# Patient Record
Sex: Female | Born: 1970 | Race: Black or African American | Hispanic: No | Smoking: Former smoker
Health system: Southern US, Community
[De-identification: ages and names within clinical notes are randomized; demographics above are authoritative.]

## PROBLEM LIST (undated history)

## (undated) DIAGNOSIS — D649 Anemia, unspecified: Secondary | ICD-10-CM

## (undated) DIAGNOSIS — B999 Unspecified infectious disease: Secondary | ICD-10-CM

## (undated) DIAGNOSIS — K862 Cyst of pancreas: Secondary | ICD-10-CM

## (undated) DIAGNOSIS — F329 Major depressive disorder, single episode, unspecified: Secondary | ICD-10-CM

## (undated) DIAGNOSIS — F419 Anxiety disorder, unspecified: Secondary | ICD-10-CM

## (undated) DIAGNOSIS — F32A Depression, unspecified: Secondary | ICD-10-CM

## (undated) HISTORY — PX: DILATION AND CURETTAGE OF UTERUS: SHX78

## (undated) HISTORY — PX: THERAPEUTIC ABORTION: SHX798

## (undated) HISTORY — PX: TUBAL LIGATION: SHX77

---

## 2003-06-08 ENCOUNTER — Emergency Department (HOSPITAL_COMMUNITY): Admission: AD | Admit: 2003-06-08 | Discharge: 2003-06-08 | Payer: Self-pay | Admitting: Family Medicine

## 2006-11-05 ENCOUNTER — Emergency Department (HOSPITAL_COMMUNITY): Admission: EM | Admit: 2006-11-05 | Discharge: 2006-11-06 | Payer: Self-pay | Admitting: *Deleted

## 2006-11-11 ENCOUNTER — Emergency Department (HOSPITAL_COMMUNITY): Admission: EM | Admit: 2006-11-11 | Discharge: 2006-11-11 | Payer: Self-pay | Admitting: Emergency Medicine

## 2008-01-06 ENCOUNTER — Emergency Department (HOSPITAL_COMMUNITY): Admission: EM | Admit: 2008-01-06 | Discharge: 2008-01-07 | Payer: Self-pay | Admitting: Emergency Medicine

## 2008-07-04 ENCOUNTER — Emergency Department (HOSPITAL_COMMUNITY): Admission: EM | Admit: 2008-07-04 | Discharge: 2008-07-04 | Payer: Self-pay | Admitting: Emergency Medicine

## 2008-11-26 ENCOUNTER — Emergency Department (HOSPITAL_COMMUNITY): Admission: EM | Admit: 2008-11-26 | Discharge: 2008-11-26 | Payer: Self-pay | Admitting: Emergency Medicine

## 2009-07-10 ENCOUNTER — Emergency Department (HOSPITAL_COMMUNITY): Admission: EM | Admit: 2009-07-10 | Discharge: 2009-07-10 | Payer: Self-pay | Admitting: Emergency Medicine

## 2009-08-28 ENCOUNTER — Emergency Department (HOSPITAL_COMMUNITY): Admission: EM | Admit: 2009-08-28 | Discharge: 2009-08-29 | Payer: Self-pay | Admitting: Emergency Medicine

## 2010-08-07 LAB — POCT I-STAT, CHEM 8
BUN: 11 mg/dL (ref 6–23)
Calcium, Ion: 1.07 mmol/L — ABNORMAL LOW (ref 1.12–1.32)
Chloride: 104 mEq/L (ref 96–112)
Creatinine, Ser: 1.2 mg/dL (ref 0.4–1.2)
HCT: 50 % — ABNORMAL HIGH (ref 36.0–46.0)
Hemoglobin: 17 g/dL — ABNORMAL HIGH (ref 12.0–15.0)
Sodium: 136 mEq/L (ref 135–145)

## 2010-08-07 LAB — WET PREP, GENITAL
Trich, Wet Prep: NONE SEEN
WBC, Wet Prep HPF POC: NONE SEEN
Yeast Wet Prep HPF POC: NONE SEEN

## 2010-08-07 LAB — URINALYSIS, ROUTINE W REFLEX MICROSCOPIC
Glucose, UA: NEGATIVE mg/dL
Hgb urine dipstick: NEGATIVE
Protein, ur: 30 mg/dL — AB
Specific Gravity, Urine: 1.04 — ABNORMAL HIGH (ref 1.005–1.030)

## 2010-08-07 LAB — CBC
Platelets: 141 10*3/uL — ABNORMAL LOW (ref 150–400)
RBC: 4.76 MIL/uL (ref 3.87–5.11)
RDW: 13.3 % (ref 11.5–15.5)
WBC: 6.4 10*3/uL (ref 4.0–10.5)

## 2010-08-07 LAB — ABO/RH: ABO/RH(D): A POS

## 2010-08-07 LAB — DIFFERENTIAL
Lymphs Abs: 1 10*3/uL (ref 0.7–4.0)
Monocytes Relative: 14 % — ABNORMAL HIGH (ref 3–12)
Neutrophils Relative %: 70 % (ref 43–77)

## 2010-08-07 LAB — GC/CHLAMYDIA PROBE AMP, GENITAL
Chlamydia, DNA Probe: NEGATIVE
GC Probe Amp, Genital: NEGATIVE

## 2010-08-07 LAB — TYPE AND SCREEN: Antibody Screen: NEGATIVE

## 2010-08-25 LAB — POCT I-STAT, CHEM 8
BUN: 11 mg/dL (ref 6–23)
Calcium, Ion: 1.16 mmol/L (ref 1.12–1.32)
Chloride: 107 mEq/L (ref 96–112)
Creatinine, Ser: 1 mg/dL (ref 0.4–1.2)
HCT: 39 % (ref 36.0–46.0)
Sodium: 139 mEq/L (ref 135–145)

## 2010-08-25 LAB — URINALYSIS, ROUTINE W REFLEX MICROSCOPIC
Bilirubin Urine: NEGATIVE
Hgb urine dipstick: NEGATIVE
Nitrite: NEGATIVE

## 2010-08-25 LAB — CBC
Hemoglobin: 13 g/dL (ref 12.0–15.0)
WBC: 3.8 10*3/uL — ABNORMAL LOW (ref 4.0–10.5)

## 2011-03-05 LAB — WET PREP, GENITAL: Yeast Wet Prep HPF POC: NONE SEEN

## 2011-03-05 LAB — URINALYSIS, ROUTINE W REFLEX MICROSCOPIC
Bilirubin Urine: NEGATIVE
Glucose, UA: NEGATIVE
Hgb urine dipstick: NEGATIVE
Ketones, ur: NEGATIVE
Nitrite: NEGATIVE
Specific Gravity, Urine: 1.027

## 2011-03-05 LAB — URINE MICROSCOPIC-ADD ON

## 2011-03-05 LAB — GC/CHLAMYDIA PROBE AMP, GENITAL: Chlamydia, DNA Probe: NEGATIVE

## 2011-03-05 LAB — PREGNANCY, URINE: Preg Test, Ur: NEGATIVE

## 2012-09-10 ENCOUNTER — Emergency Department (HOSPITAL_COMMUNITY)
Admission: EM | Admit: 2012-09-10 | Discharge: 2012-09-10 | Disposition: A | Discharge status: Home / Self Care | Payer: Self-pay | Attending: Emergency Medicine | Admitting: Emergency Medicine

## 2012-09-10 ENCOUNTER — Encounter (HOSPITAL_COMMUNITY): Payer: Self-pay | Admitting: *Deleted

## 2012-09-10 DIAGNOSIS — M545 Low back pain, unspecified: Secondary | ICD-10-CM | POA: Insufficient documentation

## 2012-09-10 DIAGNOSIS — Z760 Encounter for issue of repeat prescription: Secondary | ICD-10-CM | POA: Insufficient documentation

## 2012-09-10 DIAGNOSIS — F17200 Nicotine dependence, unspecified, uncomplicated: Secondary | ICD-10-CM | POA: Insufficient documentation

## 2012-09-10 DIAGNOSIS — G8929 Other chronic pain: Secondary | ICD-10-CM | POA: Insufficient documentation

## 2012-09-10 MED ORDER — OXYCODONE-ACETAMINOPHEN 5-325 MG PO TABS
1.0000 | ORAL_TABLET | Freq: Once | ORAL | Status: AC
  Administered 2012-09-10: 1 via ORAL
  Filled 2012-09-10: qty 1

## 2012-09-10 MED ORDER — TRAMADOL HCL 50 MG PO TABS: 50.0000 mg | ORAL_TABLET | Freq: Four times a day (QID) | ORAL | Status: DC | PRN

## 2012-09-10 NOTE — ED Notes (Signed)
Pt unhappy about not getting Percocet. "I'll just be back in the morning....this (Tramadol) is like taking Tylenol".

## 2012-09-10 NOTE — ED Notes (Signed)
Pt needs refill on percocet.  She fell through roof in June of last year and has run out of meds for chronic back pain.  Called her lawyer and he stated to come to ED for refill

## 2012-09-10 NOTE — ED Provider Notes (Signed)
History     CSN: [id number]  Arrival date & time 09/10/12  1217   First MD Initiated Contact with Patient 09/10/12 1259      Chief Complaint  Patient presents with  . Medication Refill    (Consider location/radiation/quality/duration/timing/severity/associated sxs/prior treatment) HPI Comments: Patient reports to the ED for chronic back pain. Reports that she fell through a roof in June 2013 and was given Percocet to manage her pain, but she has recently run out.  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 States she called her lawyer and was told to come to the ED for refills. Patient has been seeing chiropractor, Dr. Christell Faith, for physical therapy.  No new injury or trauma. Denies any numbness or paresthesias of lower extremities. No loss of bowel or bladder function.  Patient does not have a primary care physician at this time.  The history is provided by the patient.    History reviewed. No pertinent past medical history.  History reviewed. No pertinent past surgical history.  No family history on file.  History  Substance Use Topics  . Smoking status: Current Every Day Smoker -- 0.25 packs/day    Types: Cigarettes  . Smokeless tobacco: Not on file  . Alcohol Use: No    OB History   Grav Para Term Preterm Abortions TAB SAB Ect Mult Living                  Review of Systems  Musculoskeletal: Positive for back pain.  All other systems reviewed and are negative.    Allergies  Review of patient's allergies indicates no known allergies.  Home Medications   Current Outpatient Rx  Name  Route  Sig  Dispense  Refill  . DiphenhydrAMINE HCl (DIPHEDRYL PO)   Oral   Take 2 capsules by mouth 2 (two) times daily as needed (allergies).           BP 111/78  Pulse 100  Temp(Src) 97.9 F (36.6 C) (Oral)  Resp 16  SpO2 99%  LMP 08/29/2012  Physical Exam  Nursing note and vitals reviewed. Constitutional:  She is oriented to person, place, and time. She appears well-developed and well-nourished.  HENT:  Head: Normocephalic and atraumatic.  Mouth/Throat: Oropharynx is clear and moist.  Eyes: Conjunctivae and EOM are normal. Pupils are equal, round, and reactive to light.  Neck: Normal range of motion.  Cardiovascular: Normal rate, regular rhythm and normal heart sounds.   Pulmonary/Chest: Effort normal and breath sounds normal.  Musculoskeletal:       Lumbar back: She exhibits tenderness and pain. She exhibits normal range of motion, no bony tenderness, no swelling, no edema, no deformity, no laceration, no spasm and normal pulse.  LS pain without bony tenderness, normal ROM and strength, distal pulses and sensation intact  Neurological: She is alert and oriented to person, place, and time.  Normal gait  Skin: Skin is warm and dry.  Psychiatric: She has a normal mood and affect.    ED Course  Procedures (including critical care time)  Labs Reviewed - No data to display No results found.   1. Back pain, chronic       MDM   Patient with chronic back pain s/p injury one year ago.  No new trauma or injury, neurovascularly intact- imagining deferred at this time.  Encouraged patient to establish with PCP, she will likely need referral for pain management.  Resource guide given.  Informed patient that we do not manage chronic pain in the ED- pt is upset by this.  Rx tramadol.  Discussed plan with pt- she agreed.  Return precautions advised.      Garlon Hatchet, PA-C 09/10/12 1611

## 2012-09-10 NOTE — ED Notes (Signed)
Pt was involved in an accident in [place] where she fell through a roof. She was receiving PT but then moved to [facility] in Sept 2013. Here for a refill of her Percocet. Her lawyer told her to go to the nearest ED. Pt c/o pain from her neck to lower back.

## 2012-09-10 NOTE — ED Provider Notes (Signed)
Medical screening examination/treatment/procedure(s) were performed by non-physician practitioner and as supervising physician I was immediately available for consultation/collaboration.    Vida Roller, MD 09/10/12 2134

## 2012-11-15 ENCOUNTER — Inpatient Hospital Stay (HOSPITAL_COMMUNITY)
Admission: AD | Admit: 2012-11-15 | Discharge: 2012-11-15 | Disposition: A | Discharge status: Home / Self Care | Payer: Self-pay | Source: Ambulatory Visit | Attending: Obstetrics & Gynecology | Admitting: Obstetrics & Gynecology

## 2012-11-15 ENCOUNTER — Encounter (HOSPITAL_COMMUNITY): Payer: Self-pay | Admitting: *Deleted

## 2012-11-15 DIAGNOSIS — B9689 Other specified bacterial agents as the cause of diseases classified elsewhere: Secondary | ICD-10-CM | POA: Insufficient documentation

## 2012-11-15 DIAGNOSIS — R109 Unspecified abdominal pain: Secondary | ICD-10-CM | POA: Insufficient documentation

## 2012-11-15 DIAGNOSIS — N760 Acute vaginitis: Secondary | ICD-10-CM | POA: Insufficient documentation

## 2012-11-15 DIAGNOSIS — A499 Bacterial infection, unspecified: Secondary | ICD-10-CM | POA: Insufficient documentation

## 2012-11-15 DIAGNOSIS — A599 Trichomoniasis, unspecified: Secondary | ICD-10-CM

## 2012-11-15 DIAGNOSIS — N949 Unspecified condition associated with female genital organs and menstrual cycle: Secondary | ICD-10-CM | POA: Insufficient documentation

## 2012-11-15 DIAGNOSIS — R102 Pelvic and perineal pain: Secondary | ICD-10-CM

## 2012-11-15 DIAGNOSIS — A5901 Trichomonal vulvovaginitis: Secondary | ICD-10-CM | POA: Insufficient documentation

## 2012-11-15 HISTORY — DX: Cyst of pancreas: K86.2

## 2012-11-15 LAB — URINE MICROSCOPIC-ADD ON

## 2012-11-15 LAB — URINALYSIS, ROUTINE W REFLEX MICROSCOPIC
Bilirubin Urine: NEGATIVE
Glucose, UA: NEGATIVE mg/dL
Ketones, ur: NEGATIVE mg/dL
Protein, ur: NEGATIVE mg/dL

## 2012-11-15 LAB — WET PREP, GENITAL

## 2012-11-15 LAB — POCT PREGNANCY, URINE: Preg Test, Ur: NEGATIVE

## 2012-11-15 MED ORDER — METRONIDAZOLE 500 MG PO TABS: 500.0000 mg | ORAL_TABLET | Freq: Two times a day (BID) | ORAL | Status: DC

## 2012-11-15 NOTE — MAU Note (Signed)
Patient states she has been having lower abdominal pain for about 2 weeks. Not sure of exact date of last period. Had spotting with intercourse about one week ago, none now. Has a white discharge.

## 2012-11-15 NOTE — MAU Provider Note (Signed)
History     CSN: [id number]  Arrival date and time: 11/15/12 1652   First Provider Initiated Contact with Patient 11/15/12 1847      Chief Complaint  Patient presents with  . Abdominal Pain   HPI  Pt is here with report of lower pelvic pain x two months.  Reports increased pain with intercourse and spotting that began two weeks ago.  New sexual partner.  +vaginal odor.    Past Medical History  Diagnosis Date  . Pancreatic cyst     Past Surgical History  Procedure Laterality Date  . Dilation and curettage of uterus      History reviewed. No pertinent family history.  History  Substance Use Topics  . Smoking status: Former Smoker -- 0.25 packs/day    Types: Cigarettes  . Smokeless tobacco: Not on file  . Alcohol Use: No    Allergies: No Known Allergies  Prescriptions prior to admission  Medication Sig Dispense Refill  . DiphenhydrAMINE HCl (DIPHEDRYL PO) Take 2 capsules by mouth 2 (two) times daily as needed (allergies).      . traMADol (ULTRAM) 50 MG tablet Take 1 tablet (50 mg total) by mouth every 6 (six) hours as needed for pain.  15 tablet  0    Review of Systems  Gastrointestinal: Positive for abdominal pain (lower pelvic). Negative for nausea and vomiting.  Genitourinary: Positive for frequency.       Vaginal odor   Physical Exam   Blood pressure 106/68, pulse 66, temperature 98.7 F (37.1 C), temperature source Oral, resp. rate 16, height 5\' 4"  (1.626 m), last menstrual period 10/24/2012, SpO2 100.00%.  Physical Exam  Constitutional: She is oriented to person, place, and time. She appears well-developed and well-nourished. No distress.  HENT:  Head: Normocephalic.  Neck: Normal range of motion. Neck supple.  Cardiovascular: Normal rate, regular rhythm and normal heart sounds.   Respiratory: Effort normal and breath sounds normal. No respiratory distress.  GI: Soft. There is no tenderness (mild, lower pelvis). There is no CVA tenderness.   Genitourinary: Cervix exhibits no motion tenderness and no discharge. Vaginal discharge (white, creamy) found.  IUD strings visualized at cervical os  Musculoskeletal: Normal range of motion.  Neurological: She is alert and oriented to person, place, and time.  Skin: Skin is warm and dry.  Psychiatric: She has a normal mood and affect.    MAU Course  Procedures Results for orders placed during the hospital encounter of 11/15/12 (from the past 24 hour(s))  URINALYSIS, ROUTINE W REFLEX MICROSCOPIC     Status: Abnormal   Collection Time    11/15/12  5:15 PM      Result Value Range   Color, Urine YELLOW  YELLOW   APPearance CLEAR  CLEAR   Specific Gravity, Urine >1.030 (*) 1.005 - 1.030   pH 6.0  5.0 - 8.0   Glucose, UA NEGATIVE  NEGATIVE mg/dL   Hgb urine dipstick TRACE (*) NEGATIVE   Bilirubin Urine NEGATIVE  NEGATIVE   Ketones, ur NEGATIVE  NEGATIVE mg/dL   Protein, ur NEGATIVE  NEGATIVE mg/dL   Urobilinogen, UA 0.2  0.0 - 1.0 mg/dL   Nitrite NEGATIVE  NEGATIVE   Leukocytes, UA NEGATIVE  NEGATIVE  URINE MICROSCOPIC-ADD ON     Status: Abnormal   Collection Time    11/15/12  5:15 PM      Result Value Range   Squamous Epithelial / LPF RARE  RARE   RBC / HPF    <  3 RBC/hpf   Value: NO FORMED ELEMENTS SEEN ON URINE MICROSCOPIC EXAMINATION   Crystals CA OXALATE CRYSTALS (*) NEGATIVE  POCT PREGNANCY, URINE     Status: None   Collection Time    11/15/12  5:18 PM      Result Value Range   Preg Test, Ur NEGATIVE  NEGATIVE  WET PREP, GENITAL     Status: Abnormal   Collection Time    11/15/12  6:55 PM      Result Value Range   Yeast Wet Prep HPF POC NONE SEEN  NONE SEEN   Trich, Wet Prep FEW (*) NONE SEEN   Clue Cells Wet Prep HPF POC FEW (*) NONE SEEN   WBC, Wet Prep HPF POC FEW (*) NONE SEEN    Assessment and Plan  Trichomoniasis Bacterial Vaginosis  Plan: DC to home RX Flagyl 500 mg BID x 7 days GC/CT pending Outpatient pelvic  ultrasound   [facility] 11/15/2012, 6:49 PM

## 2012-11-16 LAB — GC/CHLAMYDIA PROBE AMP
CT Probe RNA: NEGATIVE
GC Probe RNA: NEGATIVE

## 2012-11-18 NOTE — MAU Provider Note (Signed)
Attestation of Attending Supervision of Advanced Practitioner (CNM/NP): Evaluation and management procedures were performed by the Advanced Practitioner under my supervision and collaboration. I have reviewed the Advanced Practitioner's note and chart, and I agree with the management and plan.  Trishna Cwik H. 4:14 PM   

## 2012-11-23 ENCOUNTER — Ambulatory Visit (HOSPITAL_COMMUNITY): Admission: RE | Admit: 2012-11-23 | Payer: Self-pay | Source: Ambulatory Visit

## 2014-03-20 ENCOUNTER — Encounter (HOSPITAL_COMMUNITY): Payer: Self-pay | Admitting: *Deleted

## 2014-08-02 ENCOUNTER — Encounter (HOSPITAL_COMMUNITY): Payer: Self-pay | Admitting: Emergency Medicine

## 2014-08-02 ENCOUNTER — Emergency Department (INDEPENDENT_AMBULATORY_CARE_PROVIDER_SITE_OTHER)
Admission: EM | Admit: 2014-08-02 | Discharge: 2014-08-02 | Disposition: A | Discharge status: Home / Self Care | Payer: Self-pay | Source: Home / Self Care | Attending: Family Medicine | Admitting: Family Medicine

## 2014-08-02 DIAGNOSIS — B86 Scabies: Secondary | ICD-10-CM

## 2014-08-02 MED ORDER — PERMETHRIN 5 % EX CREA: TOPICAL_CREAM | CUTANEOUS | Status: DC

## 2014-08-02 NOTE — ED Notes (Signed)
Pt c/o rash all over body onset 2 days Reports cough as well Sleeping at friends house Granddaughter is also being seen for similar sx Alert, no signs of acute distress.

## 2014-08-02 NOTE — Discharge Instructions (Signed)
Thank you for coming in today. °Apply the permethrin cream from the neck down at night and wash off in the morning. °Use Benadryl at night as needed for itching. °Use Gold Bond Itch as needed. °Come back if not getting better or worsening. ° ° °Scabies °Scabies are small bugs (mites) that burrow under the skin and cause red bumps and severe itching. These bugs can only be seen with a microscope. Scabies are highly contagious. They can spread easily from person to person by direct contact. They are also spread through sharing clothing or linens that have the scabies mites living in them. It is not unusual for an entire family to become infected through shared towels, clothing, or bedding.  °HOME CARE INSTRUCTIONS  °· Your caregiver may prescribe a cream or lotion to kill the mites. If cream is prescribed, massage the cream into the entire body from the neck to the bottom of both feet. Also massage the cream into the scalp and face if your child is less than 1 year old. Avoid the eyes and mouth. Do not wash your hands after application. °· Leave the cream on for 8 to 12 hours. Your child should bathe or shower after the 8 to 12 hour application period. Sometimes it is helpful to apply the cream to your child right before bedtime. °· One treatment is usually effective and will eliminate approximately 95% of infestations. For severe cases, your caregiver may decide to repeat the treatment in 1 week. Everyone in your household should be treated with one application of the cream. °· New rashes or burrows should not appear within 24 to 48 hours after successful treatment. However, the itching and rash may last for 2 to 4 weeks after successful treatment. Your caregiver may prescribe a medicine to help with the itching or to help the rash go away more quickly. °· Scabies can live on clothing or linens for up to 3 days. All of your child's recently used clothing, towels, stuffed toys, and bed linens should be washed in hot  water and then dried in a dryer for at least 20 minutes on high heat. Items that cannot be washed should be enclosed in a plastic bag for at least 3 days. °· To help relieve itching, bathe your child in a cool bath or apply cool washcloths to the affected areas. °· Your child may return to school after treatment with the prescribed cream. °SEEK MEDICAL CARE IF:  °· The itching persists longer than 4 weeks after treatment. °· The rash spreads or becomes infected. Signs of infection include red blisters or yellow-tan crust. °Document Released: 05/05/2005 Document Revised: 07/28/2011 Document Reviewed: 09/13/2008 °ExitCare® Patient Information ©2015 [employer]. This information is not intended to replace advice given to you by your health care provider. Make sure you discuss any questions you have with your health care provider. ° °

## 2014-08-02 NOTE — ED Provider Notes (Signed)
Diana SchleinFelicia [place] is a 44 y.o. female who presents to Urgent Care today for rash. Patient has a pruritic rash on her trunk and extremities present for the last several days. Her daughter has a similar rash. No fevers or chills vomiting or diarrhea. No treatment tried yet. Rash started after staying at somebody's house.  No new soaps detergents shampoos or cosmetics.   Past Medical History  Diagnosis Date  . Pancreatic cyst    Past Surgical History  Procedure Laterality Date  . Dilation and curettage of uterus     History  Substance Use Topics  . Smoking status: Former Smoker -- 0.25 packs/day    Types: Cigarettes  . Smokeless tobacco: Not on file  . Alcohol Use: No   ROS as above Medications: No current facility-administered medications for this encounter.   Current Outpatient Prescriptions  Medication Sig Dispense Refill  . DiphenhydrAMINE HCl (DIPHEDRYL PO) Take 2 capsules by mouth 2 (two) times daily as needed (allergies).    . metroNIDAZOLE (FLAGYL) 500 MG tablet Take 1 tablet (500 mg total) by mouth 2 (two) times daily. 14 tablet 0  . permethrin (ELIMITE) 5 % cream Apply from the neck down at night and wash off in the morning once 180 g 1  . traMADol (ULTRAM) 50 MG tablet Take 1 tablet (50 mg total) by mouth every 6 (six) hours as needed for pain. 15 tablet 0   No Known Allergies   Exam:  BP 118/75 mmHg  Pulse 71  Temp(Src) 98.7 F (37.1 C) (Oral)  Resp 16  SpO2 96% Gen: Well NAD HEENT: EOMI,  MMM no mucocutaneous lesions Lungs: Normal work of breathing. CTABL Heart: RRR no MRG Exts: Brisk capillary refill, warm and well perfused.   skin: Small pruritic excoriated papules on trunk and extremities consistent with scabies  No results found for this or any previous visit (from the past 24 hour(s)). No results found.  Assessment and Plan: 44 y.o. female with scabies. Treatment with permethrin  Discussed warning signs or symptoms. Please see discharge instructions.  Patient expresses understanding.     Rodolph BongEvan S Chaylee Ehrsam, MD 08/02/14 1435

## 2014-11-05 ENCOUNTER — Encounter (HOSPITAL_COMMUNITY): Payer: Self-pay | Admitting: *Deleted

## 2014-11-05 ENCOUNTER — Inpatient Hospital Stay (HOSPITAL_COMMUNITY)
Admission: AD | Admit: 2014-11-05 | Discharge: 2014-11-05 | Disposition: A | Discharge status: Home / Self Care | Payer: Self-pay | Source: Ambulatory Visit | Attending: Obstetrics & Gynecology | Admitting: Obstetrics & Gynecology

## 2014-11-05 DIAGNOSIS — F17210 Nicotine dependence, cigarettes, uncomplicated: Secondary | ICD-10-CM | POA: Insufficient documentation

## 2014-11-05 DIAGNOSIS — N3000 Acute cystitis without hematuria: Secondary | ICD-10-CM | POA: Insufficient documentation

## 2014-11-05 HISTORY — DX: Anemia, unspecified: D64.9

## 2014-11-05 HISTORY — DX: Unspecified infectious disease: B99.9

## 2014-11-05 LAB — URINALYSIS, ROUTINE W REFLEX MICROSCOPIC
Bilirubin Urine: NEGATIVE
Glucose, UA: NEGATIVE mg/dL
Ketones, ur: NEGATIVE mg/dL
Nitrite: NEGATIVE
Protein, ur: NEGATIVE mg/dL
Urobilinogen, UA: 0.2 mg/dL (ref 0.0–1.0)
pH: 6 (ref 5.0–8.0)

## 2014-11-05 LAB — URINE MICROSCOPIC-ADD ON

## 2014-11-05 LAB — POCT PREGNANCY, URINE: Preg Test, Ur: NEGATIVE

## 2014-11-05 MED ORDER — CIPROFLOXACIN HCL 500 MG PO TABS: 500.0000 mg | ORAL_TABLET | Freq: Two times a day (BID) | ORAL | Status: DC

## 2014-11-05 NOTE — MAU Note (Addendum)
On Wed, was on cycle, had dropped tampon, cleaned off plastic applicator with feminine wipe, since then has had pain with urination. Having freq and urgency

## 2014-11-05 NOTE — MAU Provider Note (Signed)
History     CSN: [id number]  Arrival date and time: 11/05/14 1217   First Provider Initiated Contact with Patient 11/05/14 1248      Chief Complaint  Patient presents with  . Dysuria   HPI Comments: Diana Oconnell is a 44 y.o. [id number] with onset of progressively worsening and stream dysuria over the past 5 days. She's also had urinary frequency voiding several times a day, nocturia, urgency. She is concerned that she had dropped tampon on the floor and wiped it off with Vagifem is a day before the symptoms started. She has no systemic symptoms.She denies irritative vaginal discharge. Declines pelvic exam. Does not want contraception.   Dysuria  This is a new problem. The current episode started in the past 7 days. The problem occurs every urination. The problem has been gradually worsening. The quality of the pain is described as burning. The pain is at a severity of 6/10. The pain is moderate. There has been no fever. She is sexually active. There is no history of pyelonephritis. Associated symptoms include frequency, a possible pregnancy and urgency. Pertinent negatives include no discharge, flank pain, hematuria, hesitancy, nausea, sweats or vomiting. She has tried nothing for the symptoms. There is no history of catheterization, kidney stones, recurrent UTIs, a single kidney, urinary stasis or a urological procedure. Last UTI in 1992    OB History  Gravida Para Term Preterm AB SAB TAB Ectopic Multiple Living  # Outcome Date GA Lbr Len/2nd Weight Sex Delivery Anes PTL Lv  11 TAB           10 TAB           9 TAB           8 SAB           7 SAB           6 SAB           5 Term      Vag-Spont   Y  4 Term      Vag-Spont  N Y  3 Term      Vag-Spont  N Y  2 Term      Vag-Spont  N Y  1 Term      Vag-Spont  N ND     Comments: anacephalic      Past Medical History  Diagnosis Date  . Pancreatic cyst   . Anemia   . Infection     UTI    Past  Surgical History  Procedure Laterality Date  . Dilation and curettage of uterus    . Therapeutic abortion      multiple    Family History  Problem Relation Age of Onset  . Hypertension Father     History  Substance Use Topics  . Smoking status: Current Every Day Smoker -- 0.25 packs/day for 12 years    Types: Cigarettes  . Smokeless tobacco: Never Used  . Alcohol Use: No    Allergies: No Known Allergies  Prescriptions prior to admission  Medication Sig Dispense Refill Last Dose  . DiphenhydrAMINE HCl (DIPHEDRYL PO) Take 2 capsules by mouth 2 (two) times daily as needed (allergies).   Unknown at Unknown time  . metroNIDAZOLE (FLAGYL) 500 MG tablet Take 1 tablet (500 mg total) by mouth 2 (two) times daily. 14 tablet 0 Unknown at Unknown time  . permethrin (ELIMITE) 5 % cream Apply  from the neck down at night and wash off in the morning once 180 g 1   . traMADol (ULTRAM) 50 MG tablet Take 1 tablet (50 mg total) by mouth every 6 (six) hours as needed for pain. 15 tablet 0 Unknown at Unknown time    Review of Systems  Constitutional: Negative for fever and weight loss.  Cardiovascular: Negative for chest pain.  Gastrointestinal: Negative for nausea and vomiting.  Genitourinary: Positive for dysuria, urgency and frequency. Negative for hesitancy, hematuria and flank pain.  Neurological: Negative for dizziness and headaches.  Psychiatric/Behavioral: Negative for depression. The patient is not nervous/anxious.    Physical Exam   Blood pressure 95/58, pulse 91, temperature 98.3 F (36.8 C), temperature source Oral, resp. rate 16, height 5' 5.5" (1.664 m), weight 58.968 kg (130 lb), last menstrual period 10/27/2014.  Physical Exam  Nursing note and vitals reviewed. Constitutional: She is oriented to person, place, and time. She appears well-developed and well-nourished. No distress.  HENT:  Head: Normocephalic and atraumatic.  Eyes: EOM are normal. Pupils are equal, round, and  reactive to light.  Neck: Normal range of motion. Neck supple.  Cardiovascular: Normal rate.   Respiratory: Effort normal and breath sounds normal.  GI: She exhibits no distension. There is no tenderness. There is no rebound and no guarding.  Genitourinary:  Negative CVAT  Musculoskeletal: Normal range of motion.  Neurological: She is alert and oriented to person, place, and time.  Skin: Skin is warm and dry.  Psychiatric: She has a normal mood and affect. Her behavior is normal. Thought content normal.    MAU Course  Procedures Results for orders placed or performed during the hospital encounter of 11/05/14 (from the past 24 hour(s))  Urinalysis, Routine w reflex microscopic (not at [facility])     Status: Abnormal   Collection Time: 11/05/14 12:30 PM  Result Value Ref Range   Color, Urine YELLOW YELLOW   APPearance CLOUDY (A) CLEAR   Specific Gravity, Urine >1.030 (H) 1.005 - 1.030   pH 6.0 5.0 - 8.0   Glucose, UA NEGATIVE NEGATIVE mg/dL   Hgb urine dipstick SMALL (A) NEGATIVE   Bilirubin Urine NEGATIVE NEGATIVE   Ketones, ur NEGATIVE NEGATIVE mg/dL   Protein, ur NEGATIVE NEGATIVE mg/dL   Urobilinogen, UA 0.2 0.0 - 1.0 mg/dL   Nitrite NEGATIVE NEGATIVE   Leukocytes, UA MODERATE (A) NEGATIVE  Urine microscopic-add on     Status: Abnormal   Collection Time: 11/05/14 12:30 PM  Result Value Ref Range   Squamous Epithelial / LPF FEW (A) RARE   WBC, UA 7-10 <3 WBC/hpf   RBC / HPF 0-2 <3 RBC/hpf   Bacteria, UA FEW (A) RARE  Pregnancy, urine POC     Status: None   Collection Time: 11/05/14 12:34 PM  Result Value Ref Range   Preg Test, Ur NEGATIVE NEGATIVE    Assessment and Plan   1. Acute cystitis without hematuria      Medication List    STOP taking these medications        DIPHEDRYL PO     metroNIDAZOLE 500 MG tablet  Commonly known as:  FLAGYL     permethrin 5 % cream  Commonly known as:  ELIMITE     traMADol 50 MG tablet  Commonly known as:  ULTRAM      TAKE  these medications        ciprofloxacin 500 MG tablet  Commonly known as:  CIPRO  Take 1 tablet (500  mg total) by mouth 2 (two) times daily.       Follow-up Information    Schedule an appointment as soon as possible for a visit with [facility] [place].   Why:  If symptoms worsen   Contact information:   [address] [place] [address] [phone]      Taft Worthing 11/05/2014, 12:49 PM

## 2014-11-05 NOTE — Discharge Instructions (Signed)

## 2015-03-11 ENCOUNTER — Emergency Department (HOSPITAL_COMMUNITY)
Admission: EM | Admit: 2015-03-11 | Discharge: 2015-03-12 | Disposition: A | Discharge status: Home / Self Care | Payer: Self-pay | Attending: Emergency Medicine | Admitting: Emergency Medicine

## 2015-03-11 ENCOUNTER — Encounter (HOSPITAL_COMMUNITY): Payer: Self-pay | Admitting: Vascular Surgery

## 2015-03-11 ENCOUNTER — Emergency Department (HOSPITAL_COMMUNITY): Payer: Self-pay

## 2015-03-11 ENCOUNTER — Emergency Department (HOSPITAL_COMMUNITY)
Admission: EM | Admit: 2015-03-11 | Discharge: 2015-03-11 | Discharge status: Absent without leave | Payer: Self-pay | Source: Home / Self Care

## 2015-03-11 DIAGNOSIS — Z862 Personal history of diseases of the blood and blood-forming organs and certain disorders involving the immune mechanism: Secondary | ICD-10-CM | POA: Insufficient documentation

## 2015-03-11 DIAGNOSIS — Z3202 Encounter for pregnancy test, result negative: Secondary | ICD-10-CM | POA: Insufficient documentation

## 2015-03-11 DIAGNOSIS — Z87440 Personal history of urinary (tract) infections: Secondary | ICD-10-CM | POA: Insufficient documentation

## 2015-03-11 DIAGNOSIS — Z792 Long term (current) use of antibiotics: Secondary | ICD-10-CM | POA: Insufficient documentation

## 2015-03-11 DIAGNOSIS — Z720 Tobacco use: Secondary | ICD-10-CM | POA: Insufficient documentation

## 2015-03-11 DIAGNOSIS — K862 Cyst of pancreas: Secondary | ICD-10-CM | POA: Insufficient documentation

## 2015-03-11 DIAGNOSIS — R1030 Lower abdominal pain, unspecified: Secondary | ICD-10-CM

## 2015-03-11 LAB — URINALYSIS, ROUTINE W REFLEX MICROSCOPIC
Bilirubin Urine: NEGATIVE
Glucose, UA: NEGATIVE mg/dL
Hgb urine dipstick: NEGATIVE
Ketones, ur: 15 mg/dL — AB
LEUKOCYTES UA: NEGATIVE
NITRITE: NEGATIVE
PH: 5 (ref 5.0–8.0)
Protein, ur: NEGATIVE mg/dL
SPECIFIC GRAVITY, URINE: 1.018 (ref 1.005–1.030)
Urobilinogen, UA: 0.2 mg/dL (ref 0.0–1.0)

## 2015-03-11 LAB — BASIC METABOLIC PANEL
Anion gap: 10 (ref 5–15)
BUN: 17 mg/dL (ref 6–20)
CHLORIDE: 104 mmol/L (ref 101–111)
CO2: 22 mmol/L (ref 22–32)
CREATININE: 1.23 mg/dL — AB (ref 0.44–1.00)
Calcium: 9.3 mg/dL (ref 8.9–10.3)
GFR calc non Af Amer: 54 mL/min — ABNORMAL LOW (ref >60)
Glucose, Bld: 94 mg/dL (ref 65–99)
POTASSIUM: 3.5 mmol/L (ref 3.5–5.1)
SODIUM: 136 mmol/L (ref 135–145)

## 2015-03-11 LAB — POC URINE PREG, ED: Preg Test, Ur: NEGATIVE

## 2015-03-11 MED ORDER — SODIUM CHLORIDE 0.9 % IV BOLUS (SEPSIS)
1000.0000 mL | Freq: Once | INTRAVENOUS | Status: AC
  Administered 2015-03-11: 1000 mL via INTRAVENOUS

## 2015-03-11 MED ORDER — SODIUM CHLORIDE 0.9 % IV SOLN
INTRAVENOUS | Status: DC
Start: 2015-03-11 — End: 2015-03-12

## 2015-03-11 MED ORDER — ONDANSETRON HCL 4 MG/2ML IJ SOLN
4.0000 mg | Freq: Once | INTRAMUSCULAR | Status: AC
  Administered 2015-03-11: 4 mg via INTRAVENOUS
  Filled 2015-03-11: qty 2

## 2015-03-11 MED ORDER — IOHEXOL 300 MG/ML  SOLN
100.0000 mL | Freq: Once | INTRAMUSCULAR | Status: AC | PRN
  Administered 2015-03-11: 100 mL via INTRAVENOUS

## 2015-03-11 NOTE — ED Notes (Signed)
Pt reports to the ED for eval of urinary retention that began yesterday. Reports she has not been able to urinate normally since yesterday at 12 pm. States when she gets the urge to go it will cause a sharp pain but she will only be able to get out a few drops. Also reports abd pain and constipation x 4 days. Denies any N/V, vaginal d/c, or bleeding. Pt tearful in triage. Pt A&OX4, resp e/u, and skin warm and dry.

## 2015-03-11 NOTE — ED Provider Notes (Signed)
CSN: [id number]     Arrival date & time 03/11/15  2033 History   First MD Initiated Contact with Patient 03/11/15 2219     Chief Complaint  Patient presents with  . Urinary Retention     (Consider location/radiation/quality/duration/timing/severity/associated sxs/prior Treatment) The history is provided by the patient.   we 44-year-old female acute onset of suprapubic abdominal pain yesterday a constant sharp increases when trying to urinate. Associated with urinary urgency, decreased urine, and urgency. No nausea no vomiting. No bowel movements for 4 days. Patient feels like she's constipated. Patient denies any vaginal discharge or bleeding. Pain can be 10 out of 10 at times. Currently pain is minimal. No fevers. No back pain.  Past Medical History  Diagnosis Date  . Pancreatic cyst   . Anemia   . Infection     UTI   Past Surgical History  Procedure Laterality Date  . Dilation and curettage of uterus    . Therapeutic abortion      multiple   Family History  Problem Relation Age of Onset  . Hypertension Father    Social History  Substance Use Topics  . Smoking status: Current Every Day Smoker -- 0.25 packs/day for 12 years    Types: Cigarettes  . Smokeless tobacco: Never Used  . Alcohol Use: No   OB History    Gravida Para Term Preterm AB TAB SAB Ectopic Multiple Living   Review of Systems  Constitutional: Negative for fever.  HENT: Negative for congestion.   Eyes: Negative for redness.  Respiratory: Negative for shortness of breath.   Gastrointestinal: Positive for abdominal pain and constipation. Negative for nausea, vomiting, diarrhea and abdominal distention.  Genitourinary: Positive for dysuria, urgency and difficulty urinating. Negative for vaginal bleeding and vaginal discharge.  Musculoskeletal: Negative for back pain.  Skin: Negative for rash.  Neurological: Negative for headaches.  Hematological: Does not bruise/bleed easily.   Psychiatric/Behavioral: Negative for confusion.      Allergies  Review of patient's allergies indicates no known allergies.  Home Medications   Prior to Admission medications   Medication Sig Start Date End Date Taking? Authorizing Provider  ciprofloxacin (CIPRO) 500 MG tablet Take 1 tablet (500 mg total) by mouth 2 (two) times daily. 11/05/14   Deirdre C Poe, CNM   BP 105/78 mmHg  Pulse 110  Temp(Src) 98.2 F (36.8 C) (Oral)  Resp 16  SpO2 97% Physical Exam  Constitutional: She is oriented to person, place, and time. She appears well-developed and well-nourished. No distress.  HENT:  Head: Normocephalic and atraumatic.  Mouth/Throat: Oropharynx is clear and moist.  Eyes: Conjunctivae and EOM are normal. Pupils are equal, round, and reactive to light.  Neck: Normal range of motion. Neck supple.  Cardiovascular: Normal rate, regular rhythm and normal heart sounds.   No murmur heard. Abdominal: Soft. Bowel sounds are normal. There is tenderness.  Tenderness without guarding in the suprapubic area. No left lower quadrant or right lower quadrant tenderness. No mass.  Musculoskeletal: Normal range of motion. She exhibits no edema.  Neurological: She is alert and oriented to person, place, and time. No cranial nerve deficit. She exhibits normal muscle tone. Coordination normal.  Skin: Skin is warm.  Nursing note and vitals reviewed.   ED Course  Procedures (including critical care time) Labs Review Labs Reviewed  URINALYSIS, ROUTINE W REFLEX MICROSCOPIC (NOT AT [facility]) - Abnormal; Notable for the following:  APPearance CLOUDY (*)    Ketones, ur 15 (*)    All other components within normal limits  CBC WITH DIFFERENTIAL/PLATELET  BASIC METABOLIC PANEL  POC URINE PREG, ED    Imaging Review No results found. I have personally reviewed and evaluated these images and lab results as part of my medical decision-making.   EKG Interpretation None      MDM   Final  diagnoses:  Lower abdominal pain   Patient with a onset of suprapubic abdominal pain yesterday morning. Associated with urinary dysuria and urgency. Patient only dribbling of urine. Bladder scan here showed the bladder to only have 250 mL of urine at urinalysis is normal. Patient with some tenderness over the suprapubic area. Patient denies any vaginal discharge.  We'll go ahead and get labs IV fluids and get a CT scan abdomen and pelvis. Disposition will be based on that. Patient's pregnancy test is -2. Patient's vital signs without any significant abnormalities other than some tachycardia with a heart rate of 110. No hypotension.  Patient describes the abdominal pain is sharp at times physically when she needs to urinate. Patient also states that she hasn't had a bowel movement in 4 days. Patient without any intra-abdominal surgeries in the past.    Vanetta MuldersScott Horacio Werth, MD 03/11/15 2237

## 2015-03-11 NOTE — ED Notes (Signed)
250cc's noted via bladder scan.

## 2015-03-12 LAB — CBC WITH DIFFERENTIAL/PLATELET
BASOS ABS: 0 10*3/uL (ref 0.0–0.1)
BASOS PCT: 0 %
Eosinophils Absolute: 0 10*3/uL (ref 0.0–0.7)
Eosinophils Relative: 1 %
HEMATOCRIT: 39.7 % (ref 36.0–46.0)
HEMOGLOBIN: 13.9 g/dL (ref 12.0–15.0)
Lymphocytes Relative: 51 %
Lymphs Abs: 2.6 10*3/uL (ref 0.7–4.0)
MCH: 32.6 pg (ref 26.0–34.0)
MCHC: 35 g/dL (ref 30.0–36.0)
MCV: 93 fL (ref 78.0–100.0)
MONO ABS: 0.6 10*3/uL (ref 0.1–1.0)
Monocytes Relative: 12 %
NEUTROS ABS: 1.9 10*3/uL (ref 1.7–7.7)
NEUTROS PCT: 36 %
Platelets: 207 10*3/uL (ref 150–400)
RBC: 4.27 MIL/uL (ref 3.87–5.11)
RDW: 13.4 % (ref 11.5–15.5)
WBC: 5.1 10*3/uL (ref 4.0–10.5)

## 2015-03-12 MED ORDER — DOXYCYCLINE HYCLATE 100 MG PO TABS
100.0000 mg | ORAL_TABLET | Freq: Once | ORAL | Status: AC
  Administered 2015-03-12: 100 mg via ORAL
  Filled 2015-03-12: qty 1

## 2015-03-12 MED ORDER — LIDOCAINE HCL (PF) 1 % IJ SOLN
INTRAMUSCULAR | Status: AC
  Filled 2015-03-12: qty 5

## 2015-03-12 MED ORDER — TRAMADOL HCL 50 MG PO TABS: 50.0000 mg | ORAL_TABLET | Freq: Four times a day (QID) | ORAL | Status: DC | PRN

## 2015-03-12 MED ORDER — DOXYCYCLINE HYCLATE 100 MG PO CAPS: 100.0000 mg | ORAL_CAPSULE | Freq: Two times a day (BID) | ORAL | Status: DC

## 2015-03-12 MED ORDER — AZITHROMYCIN 1 G PO PACK
1.0000 g | PACK | Freq: Once | ORAL | Status: AC
  Administered 2015-03-12: 1 g via ORAL
  Filled 2015-03-12: qty 1

## 2015-03-12 MED ORDER — CEFTRIAXONE SODIUM 250 MG IJ SOLR
250.0000 mg | Freq: Once | INTRAMUSCULAR | Status: AC
  Administered 2015-03-12: 250 mg via INTRAMUSCULAR
  Filled 2015-03-12: qty 250

## 2015-03-12 NOTE — ED Provider Notes (Signed)
Patient initially seen and evaluated by Dr. Gustavus MessingZukowski, signed out to me to evaluate CT scan results. CT shows some inflammation around the cervix consistent with inflammatory disease. This could account for her pelvic pain. There is also a cyst on the pancreas with recommendation to obtain follow-up MRCP. Patient is advised of these findings. She is given a dose of ceftriaxone in the ED as well as a dose of his azithromycin and is discharged with prescription for doxycycline and tramadol. She is to follow-up with [facility] clinic in 1 week and [facility]. Patient expressed understanding of these instructions.  Results for orders placed or performed during the hospital encounter of 03/11/15  Urinalysis, Routine w reflex microscopic-may I&O cath if menses ([facility])  Result Value Ref Range   Color, Urine YELLOW YELLOW   APPearance CLOUDY (A) CLEAR   Specific Gravity, Urine 1.018 1.005 - 1.030   pH 5.0 5.0 - 8.0   Glucose, UA NEGATIVE NEGATIVE mg/dL   Hgb urine dipstick NEGATIVE NEGATIVE   Bilirubin Urine NEGATIVE NEGATIVE   Ketones, ur 15 (A) NEGATIVE mg/dL   Protein, ur NEGATIVE NEGATIVE mg/dL   Urobilinogen, UA 0.2 0.0 - 1.0 mg/dL   Nitrite NEGATIVE NEGATIVE   Leukocytes, UA NEGATIVE NEGATIVE  CBC with Differential/Platelet  Result Value Ref Range   WBC 5.1 4.0 - 10.5 K/uL   RBC 4.27 3.87 - 5.11 MIL/uL   Hemoglobin 13.9 12.0 - 15.0 g/dL   HCT 16.139.7 09.636.0 - 04.546.0 %   MCV 93.0 78.0 - 100.0 fL   MCH 32.6 26.0 - 34.0 pg   MCHC 35.0 30.0 - 36.0 g/dL   RDW 40.913.4 81.111.5 - 91.415.5 %   Platelets 207 150 - 400 K/uL   Neutrophils Relative % 36 %   Neutro Abs 1.9 1.7 - 7.7 K/uL   Lymphocytes Relative 51 %   Lymphs Abs 2.6 0.7 - 4.0 K/uL   Monocytes Relative 12 %   Monocytes Absolute 0.6 0.1 - 1.0 K/uL   Eosinophils Relative 1 %   Eosinophils Absolute 0.0 0.0 - 0.7 K/uL   Basophils Relative 0 %   Basophils Absolute 0.0 0.0 - 0.1 K/uL   Basic metabolic panel  Result Value Ref Range   Sodium 136 135 - 145 mmol/L   Potassium 3.5 3.5 - 5.1 mmol/L   Chloride 104 101 - 111 mmol/L   CO2 22 22 - 32 mmol/L   Glucose, Bld 94 65 - 99 mg/dL   BUN 17 6 - 20 mg/dL   Creatinine, Ser 7.821.23 (H) 0.44 - 1.00 mg/dL   Calcium 9.3 8.9 - 95.610.3 mg/dL   GFR calc non Af Amer 54 (L) >60 mL/min   GFR calc Af Amer >60 >60 mL/min   Anion gap 10 5 - 15  POC urine preg, ED (not at [facility])  Result Value Ref Range   Preg Test, Ur NEGATIVE NEGATIVE   Ct Abdomen Pelvis W Contrast  03/12/2015  CLINICAL DATA:  Acute onset of difficulty urinating and dysuria. Initial encounter. EXAM: CT ABDOMEN AND PELVIS WITH CONTRAST TECHNIQUE: Multidetector CT imaging of the abdomen and pelvis was performed using the standard protocol following bolus administration of intravenous contrast. CONTRAST:  100mL OMNIPAQUE IOHEXOL 300 MG/ML  SOLN COMPARISON:  Pelvic ultrasound performed 08/28/2009 FINDINGS: The visualized lung bases are clear. The liver and spleen are unremarkable in appearance. Vaguely decreased attenuation about the gallbladder likely reflects fatty infiltration. The gallbladder is  within normal limits. There is a lobulated cystic mass measuring 1.7 cm at the distal body of the pancreas. The remainder of the pancreas is unremarkable in appearance. The adrenal glands are within normal limits. The kidneys are unremarkable in appearance. There is no evidence of hydronephrosis. No renal or ureteral stones are seen. No perinephric stranding is appreciated. The small bowel is unremarkable in appearance. The stomach is within normal limits. No acute vascular abnormalities are seen. The appendix is normal in caliber, without evidence of appendicitis. The colon is unremarkable in appearance. The bladder is moderately distended and grossly unremarkable. Vague soft tissue inflammation is noted about the cervix, of uncertain significance. The ovaries are relatively symmetric. No  suspicious adnexal masses are seen. Trace fluid within the pelvis is likely physiologic in nature. No inguinal lymphadenopathy is seen. No acute osseous abnormalities are identified. IMPRESSION: 1. Vague soft tissue inflammation about the cervix, of uncertain significance. Would correlate for any evidence of pelvic inflammatory disease. 2. Bladder unremarkable in appearance. 3. Lobulated cystic mass measuring 1.7 cm at the distal body of the pancreas. MRCP is recommended for further evaluation, to exclude malignancy. Electronically Signed   By: Roanna Raider M.D.   On: 03/12/2015 00:19    Images viewed by me.   Dione Booze, MD 03/12/15 [phone]

## 2015-03-12 NOTE — Discharge Instructions (Signed)
Your CT scan showed possible inflammation around the womb. You're being given antibiotics for this. Her CT scan also showed a cyst on your pancreas. Radiologist recommends getting an MRCP to make sure that it is not a cancer. Please follow-up with the women's clinic regarding possible pelvic infection. [facility] to make arrangements for the MRCP.  Pelvic Inflammatory Disease Pelvic inflammatory disease (PID) refers to an infection in some or all of the female organs. The infection can be in the uterus, ovaries, fallopian tubes, or the surrounding tissues in the pelvis. PID can cause abdominal or pelvic pain that comes on suddenly (acute pelvic pain). PID is a serious infection because it can lead to lasting (chronic) pelvic pain or the inability to have children (infertility). CAUSES This condition is most often caused by an infection that is spread during sexual contact. However, the infection can also be caused by the normal bacteria that are found in the vaginal tissues if these bacteria travel upward into the reproductive organs. PID can also occur following:  The birth of a baby.  A miscarriage.  An abortion.  Major pelvic surgery.  The use of an intrauterine device (IUD).  A sexual assault. RISK FACTORS This condition is more likely to develop in women who:  Are younger than 42 years of age.  Are sexually active at [facility] age.  Use nonbarrier contraception.  Have multiple sexual partners.  Have sex with someone who has symptoms of an STD (sexually transmitted disease).  Use oral contraception. At times, certain behaviors can also increase the possibility of getting PID, such as:  Using a vaginal douche.  Having an IUD in place. SYMPTOMS Symptoms of this condition include:  Abdominal or pelvic pain.  Fever.  Chills.  Abnormal vaginal discharge.  Abnormal uterine bleeding.  Unusual pain shortly after the end  of a menstrual period.  Painful urination.  Pain with sexual intercourse.  Nausea and vomiting. DIAGNOSIS To diagnose this condition, your health care provider will do a physical exam and take your medical history. A pelvic exam typically reveals great tenderness in the uterus and the surrounding pelvic tissues. You may also have tests, such as:  Lab tests, including a pregnancy test, blood tests, and urine test.  Culture tests of the vagina and cervix to check for an STD.  Ultrasound.  A laparoscopic procedure to look inside the pelvis.  Examining vaginal secretions under a microscope. TREATMENT Treatment for this condition may involve one or more approaches.  Antibiotic medicines may be prescribed to be taken by mouth.  Sexual partners may need to be treated if the infection is caused by an STD.  For more severe cases, hospitalization may be needed to give antibiotics directly into a vein through an IV tube.  Surgery may be needed if other treatments do not help, but this is rare. It may take weeks until you are completely well. If you are diagnosed with PID, you should also be checked for human immunodeficiency virus (HIV). Your health care provider may test you for infection again 3 months after treatment. You should not have unprotected sex. HOME CARE INSTRUCTIONS  Take over-the-counter and prescription medicines only as told by your health care provider.  If you were prescribed an antibiotic medicine, take it as told by your health care provider. Do not stop taking the antibiotic even if you start to feel better.  Do not have sexual intercourse until treatment is completed or as told  by your health care provider. If PID is confirmed, your recent sexual partners will need treatment, especially if you had unprotected sex.  Keep all follow-up visits as told by your health care provider. This is important. SEEK MEDICAL CARE IF:  You have increased or abnormal vaginal  discharge.  Your pain does not improve.  You vomit.  You have a fever.  You cannot tolerate your medicines.  Your partner has an STD.  You have pain when you urinate. SEEK IMMEDIATE MEDICAL CARE IF:  You have increased abdominal or pelvic pain.  You have chills.  Your symptoms are not better in 72 hours even with treatment.   This information is not intended to replace advice given to you by your health care provider. Make sure you discuss any questions you have with your health care provider.   Document Released: 05/05/2005 Document Revised: 01/24/2015 Document Reviewed: 06/12/2014 Elsevier Interactive Patient Education 2016 [employer]  Doxycycline tablets or capsules What is this medicine? DOXYCYCLINE (dox i SYE kleen) is a tetracycline antibiotic. It kills certain bacteria or stops their growth. It is used to treat many kinds of infections, like dental, skin, respiratory, and urinary tract infections. It also treats acne, Lyme disease, malaria, and certain sexually transmitted infections. This medicine may be used for other purposes; ask your health care provider or pharmacist if you have questions. What should I tell my health care provider before I take this medicine? They need to know if you have any of these conditions: -liver disease -long exposure to sunlight like working outdoors -stomach problems like colitis -an unusual or allergic reaction to doxycycline, tetracycline antibiotics, other medicines, foods, dyes, or preservatives -pregnant or trying to get pregnant -breast-feeding How should I use this medicine? Take this medicine by mouth with a full glass of water. Follow the directions on the prescription label. It is best to take this medicine without food, but if it upsets your stomach take it with food. Take your medicine at regular intervals. Do not take your medicine more often than directed. Take all of your medicine as directed even if you think you are  better. Do not skip doses or stop your medicine early. Talk to your pediatrician regarding the use of this medicine in children. While this drug may be prescribed for selected conditions, precautions do apply. Overdosage: If you think you have taken too much of this medicine contact a poison control center or emergency room at once. NOTE: This medicine is only for you. Do not share this medicine with others. What if I miss a dose? If you miss a dose, take it as soon as you can. If it is almost time for your next dose, take only that dose. Do not take double or extra doses. What may interact with this medicine? -antacids -barbiturates -birth control pills -bismuth subsalicylate -carbamazepine -methoxyflurane -other antibiotics -phenytoin -vitamins that contain iron -warfarin This list may not describe all possible interactions. Give your health care provider a list of all the medicines, herbs, non-prescription drugs, or dietary supplements you use. Also tell them if you smoke, drink alcohol, or use illegal drugs. Some items may interact with your medicine. What should I watch for while using this medicine? Tell your doctor or health care professional if your symptoms do not improve. Do not treat diarrhea with over the counter products. Contact your doctor if you have diarrhea that lasts more than 2 days or if it is severe and watery. Do not take this medicine just  before going to bed. It may not dissolve properly when you lay down and can cause pain in your throat. Drink plenty of fluids while taking this medicine to also help reduce irritation in your throat. This medicine can make you more sensitive to the sun. Keep out of the sun. If you cannot avoid being in the sun, wear protective clothing and use sunscreen. Do not use sun lamps or tanning beds/booths. Birth control pills may not work properly while you are taking this medicine. Talk to your doctor about using an extra method of birth  control. If you are being treated for a sexually transmitted infection, avoid sexual contact until you have finished your treatment. Your sexual partner may also need treatment. Avoid antacids, aluminum, calcium, magnesium, and iron products for 4 hours before and 2 hours after taking a dose of this medicine. If you are using this medicine to prevent malaria, you should still protect yourself from contact with mosquitos. Stay in screened-in areas, use mosquito nets, keep your body covered, and use an insect repellent. What side effects may I notice from receiving this medicine? Side effects that you should report to your doctor or health care professional as soon as possible: -allergic reactions like skin rash, itching or hives, swelling of the face, lips, or tongue -difficulty breathing -fever -itching in the rectal or genital area -pain on swallowing -redness, blistering, peeling or loosening of the skin, including inside the mouth -severe stomach pain or cramps -unusual bleeding or bruising -unusually weak or tired -yellowing of the eyes or skin Side effects that usually do not require medical attention (report to your doctor or health care professional if they continue or are bothersome): -diarrhea -loss of appetite -nausea, vomiting This list may not describe all possible side effects. Call your doctor for medical advice about side effects. You may report side effects to FDA at 1-800-FDA-1088. Where should I keep my medicine? Keep out of the reach of children. Store at room temperature, below 30 degrees C (86 degrees F). Protect from light. Keep container tightly closed. Throw away any unused medicine after the expiration date. Taking this medicine after the expiration date can make you seriously ill. NOTE: This sheet is a summary. It may not cover all possible information. If you have questions about this medicine, talk to your doctor, pharmacist, or health care provider.    2016,  Elsevier/Gold Standard. (2014-08-25 12:10:28)  Tramadol tablets What is this medicine? TRAMADOL (TRA ma dole) is a pain reliever. It is used to treat moderate to severe pain in adults. This medicine may be used for other purposes; ask your health care provider or pharmacist if you have questions. What should I tell my health care provider before I take this medicine? They need to know if you have any of these conditions: -brain tumor -depression -drug abuse or addiction -head injury -if you frequently drink alcohol containing drinks -kidney disease or trouble passing urine -liver disease -lung disease, asthma, or breathing problems -seizures or epilepsy -suicidal thoughts, plans, or attempt; a previous suicide attempt by you or a family member -an unusual or allergic reaction to tramadol, codeine, other medicines, foods, dyes, or preservatives -pregnant or trying to get pregnant -breast-feeding How should I use this medicine? Take this medicine by mouth with a full glass of water. Follow the directions on the prescription label. If the medicine upsets your stomach, take it with food or milk. Do not take more medicine than you are told to take. Talk to  your pediatrician regarding the use of this medicine in children. Special care may be needed. Overdosage: If you think you have taken too much of this medicine contact a poison control center or emergency room at once. NOTE: This medicine is only for you. Do not share this medicine with others. What if I miss a dose? If you miss a dose, take it as soon as you can. If it is almost time for your next dose, take only that dose. Do not take double or extra doses. What may interact with this medicine? Do not take this medicine with any of the following medications: -MAOIs like Carbex, Eldepryl, Marplan, Nardil, and Parnate This medicine may also interact with the following medications: -alcohol or medicines that contain  alcohol -antihistamines -benzodiazepines -bupropion -carbamazepine or oxcarbazepine -clozapine -cyclobenzaprine -digoxin -furazolidone -linezolid -medicines for depression, anxiety, or psychotic disturbances -medicines for migraine headache like almotriptan, eletriptan, frovatriptan, naratriptan, rizatriptan, sumatriptan, zolmitriptan -medicines for pain like pentazocine, buprenorphine, butorphanol, meperidine, nalbuphine, and propoxyphene -medicines for sleep -muscle relaxants -naltrexone -phenobarbital -phenothiazines like perphenazine, thioridazine, chlorpromazine, mesoridazine, fluphenazine, prochlorperazine, promazine, and trifluoperazine -procarbazine -warfarin This list may not describe all possible interactions. Give your health care provider a list of all the medicines, herbs, non-prescription drugs, or dietary supplements you use. Also tell them if you smoke, drink alcohol, or use illegal drugs. Some items may interact with your medicine. What should I watch for while using this medicine? Tell your doctor or health care professional if your pain does not go away, if it gets worse, or if you have new or a different type of pain. You may develop tolerance to the medicine. Tolerance means that you will need a higher dose of the medicine for pain relief. Tolerance is normal and is expected if you take this medicine for a long time. Do not suddenly stop taking your medicine because you may develop a severe reaction. Your body becomes used to the medicine. This does NOT mean you are addicted. Addiction is a behavior related to getting and using a drug for a non-medical reason. If you have pain, you have a medical reason to take pain medicine. Your doctor will tell you how much medicine to take. If your doctor wants you to stop the medicine, the dose will be slowly lowered over time to avoid any side effects. You may get drowsy or dizzy. Do not drive, use machinery, or do anything that  needs mental alertness until you know how this medicine affects you. Do not stand or sit up quickly, especially if you are an older patient. This reduces the risk of dizzy or fainting spells. Alcohol can increase or decrease the effects of this medicine. Avoid alcoholic drinks. You may have constipation. Try to have a bowel movement at least every 2 to 3 days. If you do not have a bowel movement for 3 days, call your doctor or health care professional. Your mouth may get dry. Chewing sugarless gum or sucking hard candy, and drinking plenty of water may help. Contact your doctor if the problem does not go away or is severe. What side effects may I notice from receiving this medicine? Side effects that you should report to your doctor or health care professional as soon as possible: -allergic reactions like skin rash, itching or hives, swelling of the face, lips, or tongue -breathing difficulties, wheezing -confusion -itching -light headedness or fainting spells -redness, blistering, peeling or loosening of the skin, including inside the mouth -seizures Side effects that usually do  not require medical attention (report to your doctor or health care professional if they continue or are bothersome): -constipation -dizziness -drowsiness -headache -nausea, vomiting This list may not describe all possible side effects. Call your doctor for medical advice about side effects. You may report side effects to FDA at 1-800-FDA-1088. Where should I keep my medicine? Keep out of the reach of children. This medicine may cause accidental overdose and death if it taken by other adults, children, or pets. Mix any unused medicine with a substance like cat litter or coffee grounds. Then throw the medicine away in a sealed container like a sealed bag or a coffee can with a lid. Do not use the medicine after the expiration date. Store at room temperature between 15 and 30 degrees C (59 and 86 degrees F). NOTE: This  sheet is a summary. It may not cover all possible information. If you have questions about this medicine, talk to your doctor, pharmacist, or health care provider.    2016, Elsevier/Gold Standard. (2013-07-01 15:42:09)

## 2015-05-14 ENCOUNTER — Encounter (HOSPITAL_COMMUNITY): Payer: Self-pay | Admitting: Emergency Medicine

## 2015-05-14 DIAGNOSIS — R109 Unspecified abdominal pain: Secondary | ICD-10-CM | POA: Insufficient documentation

## 2015-05-14 DIAGNOSIS — F17210 Nicotine dependence, cigarettes, uncomplicated: Secondary | ICD-10-CM | POA: Insufficient documentation

## 2015-05-14 LAB — CBC
HCT: 39.2 % (ref 36.0–46.0)
Hemoglobin: 13.1 g/dL (ref 12.0–15.0)
MCH: 31.6 pg (ref 26.0–34.0)
MCHC: 33.4 g/dL (ref 30.0–36.0)
MCV: 94.5 fL (ref 78.0–100.0)
Platelets: 197 10*3/uL (ref 150–400)
RBC: 4.15 MIL/uL (ref 3.87–5.11)
RDW: 13.3 % (ref 11.5–15.5)
WBC: 4.9 10*3/uL (ref 4.0–10.5)

## 2015-05-14 LAB — COMPREHENSIVE METABOLIC PANEL
ALBUMIN: 3.1 g/dL — AB (ref 3.5–5.0)
ALT: 11 U/L — ABNORMAL LOW (ref 14–54)
AST: 13 U/L — AB (ref 15–41)
Alkaline Phosphatase: 49 U/L (ref 38–126)
Anion gap: 7 (ref 5–15)
BILIRUBIN TOTAL: 1 mg/dL (ref 0.3–1.2)
BUN: 13 mg/dL (ref 6–20)
CHLORIDE: 111 mmol/L (ref 101–111)
CO2: 23 mmol/L (ref 22–32)
Calcium: 9 mg/dL (ref 8.9–10.3)
Creatinine, Ser: 0.89 mg/dL (ref 0.44–1.00)
GFR calc Af Amer: >60 mL/min (ref >60)
GFR calc non Af Amer: >60 mL/min (ref >60)
GLUCOSE: 100 mg/dL — AB (ref 65–99)
POTASSIUM: 4 mmol/L (ref 3.5–5.1)
Sodium: 141 mmol/L (ref 135–145)
TOTAL PROTEIN: 6.2 g/dL — AB (ref 6.5–8.1)

## 2015-05-14 LAB — I-STAT BETA HCG BLOOD, ED (MC, WL, AP ONLY): I-stat hCG, quantitative: <5 m[IU]/mL (ref <5)

## 2015-05-14 LAB — LIPASE, BLOOD: Lipase: 31 U/L (ref 11–51)

## 2015-05-14 NOTE — ED Notes (Signed)
Patient here with complaint of right flank pain with radiation laterally around abdomen and towards groin. States onset this AM. Endorses intolerance of PO intake causing her to vomit each time. Denies urinary symptoms.

## 2015-05-15 ENCOUNTER — Emergency Department (HOSPITAL_COMMUNITY)
Admission: EM | Admit: 2015-05-15 | Discharge: 2015-05-15 | Discharge status: Against Medical Advice | Payer: Self-pay | Attending: Emergency Medicine | Admitting: Emergency Medicine

## 2015-05-15 LAB — URINALYSIS, ROUTINE W REFLEX MICROSCOPIC
BILIRUBIN URINE: NEGATIVE
GLUCOSE, UA: NEGATIVE mg/dL
KETONES UR: NEGATIVE mg/dL
LEUKOCYTES UA: NEGATIVE
Nitrite: NEGATIVE
PH: 6 (ref 5.0–8.0)
Protein, ur: NEGATIVE mg/dL
SPECIFIC GRAVITY, URINE: 1.029 (ref 1.005–1.030)

## 2015-05-15 LAB — URINE MICROSCOPIC-ADD ON: WBC, UA: NONE SEEN WBC/hpf (ref 0–5)

## 2015-05-15 NOTE — ED Notes (Signed)
Called to bring back to a room x2. No answer

## 2016-04-04 ENCOUNTER — Ambulatory Visit (HOSPITAL_COMMUNITY)
Admission: EM | Admit: 2016-04-04 | Discharge: 2016-04-04 | Disposition: A | Discharge status: Home / Self Care | Payer: Medicaid Other | Attending: Family Medicine | Admitting: Family Medicine

## 2016-04-04 ENCOUNTER — Encounter (HOSPITAL_COMMUNITY): Payer: Self-pay | Admitting: Family Medicine

## 2016-04-04 DIAGNOSIS — B9689 Other specified bacterial agents as the cause of diseases classified elsewhere: Secondary | ICD-10-CM

## 2016-04-04 DIAGNOSIS — Z202 Contact with and (suspected) exposure to infections with a predominantly sexual mode of transmission: Secondary | ICD-10-CM | POA: Diagnosis present

## 2016-04-04 DIAGNOSIS — N760 Acute vaginitis: Secondary | ICD-10-CM | POA: Diagnosis not present

## 2016-04-04 DIAGNOSIS — L232 Allergic contact dermatitis due to cosmetics: Secondary | ICD-10-CM

## 2016-04-04 LAB — POCT URINALYSIS DIP (DEVICE)
BILIRUBIN URINE: NEGATIVE
GLUCOSE, UA: NEGATIVE mg/dL
Hgb urine dipstick: NEGATIVE
Leukocytes, UA: NEGATIVE
Nitrite: NEGATIVE
PH: 6 (ref 5.0–8.0)
Protein, ur: NEGATIVE mg/dL
SPECIFIC GRAVITY, URINE: 1.025 (ref 1.005–1.030)
Urobilinogen, UA: 0.2 mg/dL (ref 0.0–1.0)

## 2016-04-04 LAB — POCT PREGNANCY, URINE: PREG TEST UR: NEGATIVE

## 2016-04-04 MED ORDER — FLUTICASONE PROPIONATE 0.05 % EX CREA: TOPICAL_CREAM | Freq: Two times a day (BID) | CUTANEOUS | 0 refills | Status: DC

## 2016-04-04 MED ORDER — METRONIDAZOLE 0.75 % VA GEL: 1.0000 | Freq: Every day | VAGINAL | 0 refills | Status: DC

## 2016-04-04 NOTE — ED Provider Notes (Signed)
[facility]    CSN: [id number] Arrival date & time: 04/04/16  1104     History   Chief Complaint Chief Complaint  Patient presents with  . Exposure to STD    HPI Diana Oconnell is a 45 y.o. female.   The history is provided by the patient.  Exposure to STD  This is a chronic problem. The current episode started more than 1 week ago. The problem has not changed since onset.Pertinent negatives include no chest pain and no abdominal pain.    Past Medical History:  Diagnosis Date  . Anemia   . Infection    UTI  . Pancreatic cyst     There are no active problems to display for this patient.   Past Surgical History:  Procedure Laterality Date  . DILATION AND CURETTAGE OF UTERUS    . THERAPEUTIC ABORTION     multiple    OB History    Gravida Para Term Preterm AB Living   11 5 5   6 8    SAB TAB Ectopic Multiple Live Births   3 3     5        Home Medications    Prior to Admission medications   Not on File    Family History Family History  Problem Relation Age of Onset  . Hypertension Father     Social History Social History  Substance Use Topics  . Smoking status: Current Every Day Smoker    Packs/day: 0.25    Years: 12.00    Types: Cigarettes  . Smokeless tobacco: Never Used  . Alcohol use No     Allergies   Patient has no known allergies.   Review of Systems Review of Systems  Constitutional: Negative.   Cardiovascular: Negative for chest pain.  Gastrointestinal: Negative for abdominal pain.  Genitourinary: Positive for vaginal discharge. Negative for vaginal bleeding and vaginal pain.  All other systems reviewed and are negative.    Physical Exam Triage Vital Signs ED Triage Vitals [04/04/16 1117]  Enc Vitals Group     BP 118/80     Pulse Rate 73     Resp 18     Temp 98.6 F (37 C)     Temp src      SpO2 100 %     Weight      Height      Head Circumference      Peak Flow      Pain Score      Pain Loc       Pain Edu?      Excl. in GC?    No data found.   Updated Vital Signs BP 118/80   Pulse 73   Temp 98.6 F (37 C)   Resp 18   SpO2 100%   Visual Acuity Right Eye Distance:   Left Eye Distance:   Bilateral Distance:    Right Eye Near:   Left Eye Near:    Bilateral Near:     Physical Exam  Constitutional: She is oriented to person, place, and time. She appears well-developed and well-nourished.  Abdominal: Soft. Bowel sounds are normal.  Genitourinary: Uterus normal. Vaginal discharge found.  Neurological: She is alert and oriented to person, place, and time.  Skin: Skin is warm and dry. Rash noted.  Nursing note and vitals reviewed.    UC Treatments / Results  Labs (all labs ordered are listed, but only abnormal results are displayed) Labs Reviewed  POCT URINALYSIS  DIP (DEVICE) - Abnormal; Notable for the following:       Result Value   Ketones, ur TRACE (*)    All other components within normal limits  POCT PREGNANCY, URINE    EKG  EKG Interpretation None       Radiology No results found.  Procedures Procedures (including critical care time)  Medications Ordered in UC Medications - No data to display   Initial Impression / Assessment and Plan / UC Course  I have reviewed the triage vital signs and the nursing notes.  Pertinent labs & imaging results that were available during my care of the patient were reviewed by me and considered in my medical decision making (see chart for details).  Clinical Course       Final Clinical Impressions(s) / UC Diagnoses   Final diagnoses:  None    New Prescriptions New Prescriptions   No medications on file     Linna HoffJames D Tayshawn Purnell, MD 04/04/16 1207

## 2016-04-04 NOTE — ED Triage Notes (Signed)
Pt here for 3 months of intermittent discharge. sts also an odor. sts she had unprotected sex a few months ago and possible STD.

## 2016-04-07 LAB — CERVICOVAGINAL ANCILLARY ONLY
Chlamydia: NEGATIVE
Neisseria Gonorrhea: NEGATIVE

## 2016-04-08 LAB — CERVICOVAGINAL ANCILLARY ONLY: Wet Prep (BD Affirm): POSITIVE — AB

## 2016-04-15 ENCOUNTER — Telehealth (HOSPITAL_COMMUNITY): Payer: Self-pay | Admitting: Emergency Medicine

## 2016-04-15 NOTE — Telephone Encounter (Signed)
-----   Message from Eustace MooreLaura W Murray, MD sent at 04/09/2016  8:24 AM EST ----- Please let patient know that test for gardnerella (bacterial vaginosis) was positive.  [facility]  Recheck for further evaluation if symptoms persist.  LM

## 2016-04-15 NOTE — Telephone Encounter (Signed)
LM on [phone] Need to give lab results and to see how pt is doing from recent visit on 11/17 Notified pt in gen message that there is NO need to call back Unless not feeling any better, not tolerating meds well or if wanting to know lab results. Also let pt know labs can be obtained from MyChart

## 2016-09-11 ENCOUNTER — Emergency Department (HOSPITAL_COMMUNITY): Payer: Medicaid Other

## 2016-09-11 ENCOUNTER — Encounter (HOSPITAL_COMMUNITY): Payer: Self-pay | Admitting: Emergency Medicine

## 2016-09-11 ENCOUNTER — Emergency Department (HOSPITAL_COMMUNITY)
Admission: EM | Admit: 2016-09-11 | Discharge: 2016-09-11 | Disposition: A | Discharge status: Home / Self Care | Payer: Medicaid Other | Attending: Emergency Medicine | Admitting: Emergency Medicine

## 2016-09-11 DIAGNOSIS — Z79899 Other long term (current) drug therapy: Secondary | ICD-10-CM | POA: Diagnosis not present

## 2016-09-11 DIAGNOSIS — R072 Precordial pain: Secondary | ICD-10-CM | POA: Diagnosis not present

## 2016-09-11 DIAGNOSIS — F17210 Nicotine dependence, cigarettes, uncomplicated: Secondary | ICD-10-CM | POA: Diagnosis not present

## 2016-09-11 DIAGNOSIS — R071 Chest pain on breathing: Secondary | ICD-10-CM | POA: Diagnosis present

## 2016-09-11 LAB — COMPREHENSIVE METABOLIC PANEL
ALBUMIN: 3.6 g/dL (ref 3.5–5.0)
ALK PHOS: 45 U/L (ref 38–126)
ALT: 18 U/L (ref 14–54)
ANION GAP: 7 (ref 5–15)
AST: 23 U/L (ref 15–41)
BILIRUBIN TOTAL: 1.3 mg/dL — AB (ref 0.3–1.2)
BUN: 14 mg/dL (ref 6–20)
CALCIUM: 9.2 mg/dL (ref 8.9–10.3)
CO2: 24 mmol/L (ref 22–32)
Chloride: 105 mmol/L (ref 101–111)
Creatinine, Ser: 0.81 mg/dL (ref 0.44–1.00)
GFR calc Af Amer: >60 mL/min (ref >60)
GLUCOSE: 71 mg/dL (ref 65–99)
Potassium: 4 mmol/L (ref 3.5–5.1)
Sodium: 136 mmol/L (ref 135–145)
TOTAL PROTEIN: 6.5 g/dL (ref 6.5–8.1)

## 2016-09-11 LAB — I-STAT BETA HCG BLOOD, ED (MC, WL, AP ONLY)

## 2016-09-11 LAB — I-STAT TROPONIN, ED: TROPONIN I, POC: 0 ng/mL (ref 0.00–0.08)

## 2016-09-11 LAB — CBC WITH DIFFERENTIAL/PLATELET
Basophils Absolute: 0 10*3/uL (ref 0.0–0.1)
Basophils Relative: 0 %
Eosinophils Absolute: 0.1 10*3/uL (ref 0.0–0.7)
Eosinophils Relative: 2 %
HEMATOCRIT: 37.9 % (ref 36.0–46.0)
HEMOGLOBIN: 13 g/dL (ref 12.0–15.0)
LYMPHS ABS: 2.5 10*3/uL (ref 0.7–4.0)
LYMPHS PCT: 49 %
MCH: 31.9 pg (ref 26.0–34.0)
MCHC: 34.3 g/dL (ref 30.0–36.0)
MCV: 92.9 fL (ref 78.0–100.0)
MONO ABS: 0.6 10*3/uL (ref 0.1–1.0)
MONOS PCT: 11 %
NEUTROS ABS: 1.9 10*3/uL (ref 1.7–7.7)
NEUTROS PCT: 38 %
Platelets: 210 10*3/uL (ref 150–400)
RBC: 4.08 MIL/uL (ref 3.87–5.11)
RDW: 13 % (ref 11.5–15.5)
WBC: 5.1 10*3/uL (ref 4.0–10.5)

## 2016-09-11 LAB — TROPONIN I: Troponin I: <0.03 ng/mL (ref <0.03)

## 2016-09-11 NOTE — ED Notes (Signed)
Pt A&OX4, ambulatory at d/c with steady gait, NAD 

## 2016-09-11 NOTE — Discharge Instructions (Signed)
Please instructions below. Schedule an appointment with Cardiology to follow up on your EKG changes. Follow up with your primary care about your feelings of anxiety. Return to the ER for worsening chest pain associated with pain in your left arm, jaw, nausea, or new or worsening symptoms.

## 2016-09-11 NOTE — ED Notes (Signed)
Got patient undressed on the monitor did ekg shown to Dr Jeraldine Loots

## 2016-09-11 NOTE — ED Provider Notes (Signed)
[facility]-EMERGENCY DEPT Provider Note   CSN: [id number] Arrival date & time: 09/11/16  1023     History   Chief Complaint Chief Complaint  Patient presents with  . Chest Pain    HPI Diana Oconnell is a 46 y.o. female.  Pt presents w acute onset of intermittent sharp central chest pains that began this morning. Pt states she was sitting down at her vanity and had sharp central chest pain that lasted about 2 minutes. Had 2-3 more episodes later in the morning while at work. During episodes of CP, pt experienced SOB and some inc CP w inspiration. States she felt weak and shaky after episodes at work and drank orange juice and had some candy thinking her blood sugar was low; some relief after orange juice but then CP recurred so she decided to come to ER. States palpating her anterior chest is painful. Recent flu-like illness last week w N/V, cough, myalgias that has since resolved. Pt is no longer coughing. Denies significant cardiac hx or daily medications. Denies abd pain, N/V, F.      Past Medical History:  Diagnosis Date  . Anemia   . Infection    UTI  . Pancreatic cyst     There are no active problems to display for this patient.   Past Surgical History:  Procedure Laterality Date  . DILATION AND CURETTAGE OF UTERUS    . THERAPEUTIC ABORTION     multiple    OB History    Gravida Para Term Preterm AB Living   SAB TAB Ectopic Multiple Live Births   Home Medications    Prior to Admission medications   Medication Sig Start Date End Date Taking? Authorizing Provider  ondansetron (ZOFRAN-ODT) 4 MG disintegrating tablet Take 4 mg by mouth daily as needed for nausea/vomiting. 06/15/16  Yes Historical Provider, MD  Probiotic Product (PRO-BIOTIC BLEND PO) Take 1 capsule by mouth daily.   Yes Historical Provider, MD  fluticasone (CUTIVATE) 0.05 % cream Apply topically 2 (two) times daily. Patient not taking: Reported on 09/11/2016  04/04/16   Linna Hoff, MD  metroNIDAZOLE (METROGEL VAGINAL) 0.75 % vaginal gel Place 1 Applicatorful vaginally at bedtime. For 1 wk Patient not taking: Reported on 09/11/2016 04/04/16   Linna Hoff, MD    Family History Family History  Problem Relation Age of Onset  . Hypertension Father     Social History Social History  Substance Use Topics  . Smoking status: Current Every Day Smoker    Packs/day: 0.25    Years: 12.00    Types: Cigarettes  . Smokeless tobacco: Never Used  . Alcohol use Yes     Comment: 1 drink/day     Allergies   Patient has no known allergies.   Review of Systems Review of Systems  Constitutional: Negative for diaphoresis and fever.  Respiratory: Positive for shortness of breath. Negative for cough.   Cardiovascular: Positive for chest pain.  Gastrointestinal: Negative for abdominal pain, nausea and vomiting.  Neurological: Positive for weakness.  Psychiatric/Behavioral: The patient is nervous/anxious.      Physical Exam Updated Vital Signs BP 106/68   Pulse 71   Temp 98 F (36.7 C) (Oral)   Resp 19   Wt 63 kg   LMP 08/20/2016   SpO2 95%   BMI 21.77 kg/m   Physical Exam  Constitutional: She appears  well-developed and well-nourished.  well-appearing  HENT:  Head: Normocephalic and atraumatic.  Eyes: Conjunctivae are normal.  Neck: Normal range of motion. Neck supple. No tracheal deviation present.  Cardiovascular: Normal rate, regular rhythm, normal heart sounds and intact distal pulses.  Exam reveals no gallop and no friction rub.   No murmur heard. Pulmonary/Chest: Effort normal and breath sounds normal. No respiratory distress. She has no wheezes. She has no rales. She exhibits tenderness (anterior central chest).  Abdominal: Soft. Bowel sounds are normal. She exhibits no distension. There is no tenderness. There is no rebound.  Psychiatric: She has a normal mood and affect. Her behavior is normal.  Nursing note and vitals  reviewed.    ED Treatments / Results  Labs (all labs ordered are listed, but only abnormal results are displayed) Labs Reviewed  COMPREHENSIVE METABOLIC PANEL - Abnormal; Notable for the following:       Result Value   Total Bilirubin 1.3 (*)    All other components within normal limits  TROPONIN I  CBC WITH DIFFERENTIAL/PLATELET  I-STAT BETA HCG BLOOD, ED ([facility], [facility], [facility] ONLY)  I-STAT TROPOININ, ED    EKG  EKG Interpretation  Date/Time:  Thursday September 11 2016 10:25:34 EDT Ventricular Rate:  69 PR Interval:    QRS Duration: 92 QT Interval:  409 QTC Calculation: 439 R Axis:   -25 Text Interpretation:  Sinus rhythm Borderline left axis deviation Anteroseptal infarct, age indeterminate ST-t wave abnormality Artifact Abnormal ekg Confirmed by Gerhard Munch  MD [phone]) on 09/11/2016 10:31:01 AM       Radiology Dg Chest 2 View  Result Date: 09/11/2016 CLINICAL DATA:  Left-sided chest pain and shortness of Breath EXAM: CHEST  2 VIEW COMPARISON:  06/15/2016 FINDINGS: The heart size and mediastinal contours are within normal limits. Both lungs are clear. The visualized skeletal structures are unremarkable. IMPRESSION: No active cardiopulmonary disease. Electronically Signed   By: Alcide Clever M.D.   On: 09/11/2016 11:35    Procedures Procedures (including critical care time)  Medications Ordered in ED Medications - No data to display   Initial Impression / Assessment and Plan / ED Course  I have reviewed the triage vital signs and the nursing notes.  Pertinent labs & imaging results that were available during my care of the patient were reviewed by me and considered in my medical decision making (see chart for details).     Pt w anterior chest wall pain; suspect anxiety could be a factor. Patient is to be discharged with recommendation to follow up with PCP in regards to today's hospital visit. Chest pain is not likely of cardiac or pulmonary etiology d/t presentation, PERC  negative, VSS, no tracheal deviation, no JVD or new murmur, RRR, breath sounds equal bilaterally, EKG with nonspecific changes, negative troponin x2, and negative CXR. Pt has been advised to return to the ED if CP becomes exertional, associated with diaphoresis or nausea, radiates to left jaw/arm, worsens or becomes concerning in any way. Cardiology referral for f/u on nonspecific EKG findings. PCP follow up for feelings of anxiety. Pt appears reliable for follow up and is agreeable to discharge.   Patient discussed with Dr. Jeraldine Loots.  Discussed results, findings, treatment and follow up. Patient advised of return precautions. Patient verbalized understanding and agreed with plan.   Final Clinical Impressions(s) / ED Diagnoses   Final diagnoses:  Precordial chest pain    New Prescriptions Discharge Medication List as of 09/11/2016  3:14 PM  [place] N Russo, PA-C 09/11/16 1621    Gerhard Munch, MD 09/18/16 [phone]

## 2016-09-11 NOTE — ED Triage Notes (Signed)
Pt arrives via [facility] from work where she's been having flu like symptoms all week. N/V, cough, saw PMD Friday. Today began having sharp chest pain with inspiration and coughing. Denies recent fever. 18g LAC, 12NSR, CBG 140. VSS.

## 2016-09-11 NOTE — ED Notes (Signed)
Pt ambulated to restroom. Steady gait tolerated well

## 2016-09-11 NOTE — ED Notes (Signed)
Pt brought back from xray and connected to cardiac monitor, pulse ox and BP cuff.

## 2016-10-09 ENCOUNTER — Encounter (HOSPITAL_COMMUNITY): Payer: Self-pay | Admitting: Emergency Medicine

## 2016-10-09 ENCOUNTER — Ambulatory Visit (HOSPITAL_COMMUNITY)
Admission: EM | Admit: 2016-10-09 | Discharge: 2016-10-09 | Disposition: A | Discharge status: Home / Self Care | Payer: Medicaid Other | Attending: Family | Admitting: Family

## 2016-10-09 DIAGNOSIS — Z79899 Other long term (current) drug therapy: Secondary | ICD-10-CM | POA: Insufficient documentation

## 2016-10-09 DIAGNOSIS — J029 Acute pharyngitis, unspecified: Secondary | ICD-10-CM | POA: Insufficient documentation

## 2016-10-09 LAB — POCT RAPID STREP A: Streptococcus, Group A Screen (Direct): NEGATIVE

## 2016-10-09 MED ORDER — LIDOCAINE VISCOUS 2 % MT SOLN: 10.0000 mL | OROMUCOSAL | 0 refills | Status: DC | PRN

## 2016-10-09 MED ORDER — IBUPROFEN 600 MG PO TABS: 600.0000 mg | ORAL_TABLET | Freq: Four times a day (QID) | ORAL | 0 refills | Status: DC | PRN

## 2016-10-09 NOTE — ED Provider Notes (Signed)
CSN: [id number]     Arrival date & time 10/09/16  1723 History   First MD Initiated Contact with Patient 10/09/16 1755     Chief Complaint  Patient presents with  . Sore Throat   (Consider location/radiation/quality/duration/timing/severity/associated sxs/prior Treatment) [id number] year old female presents with sore throat, body aches, chills that started yesterday. Also having slight coughing, post nasal drainage and nausea. Denies any fever, vomiting or diarrhea. She has not taken any medication for symptoms. Son was also sick with similar symptoms about 1 week ago- recovered now. No chronic health issues. Takes no daily medication.    The history is provided by the patient.    Past Medical History:  Diagnosis Date  . Anemia   . Infection    UTI  . Pancreatic cyst    Past Surgical History:  Procedure Laterality Date  . DILATION AND CURETTAGE OF UTERUS    . THERAPEUTIC ABORTION     multiple   Family History  Problem Relation Age of Onset  . Hypertension Father    Social History  Substance Use Topics  . Smoking status: Current Every Day Smoker    Packs/day: 0.25    Years: 12.00    Types: Cigarettes  . Smokeless tobacco: Never Used  . Alcohol use Yes     Comment: 1 drink/day   OB History    Gravida Para Term Preterm AB Living   11 5 5   6 5    SAB TAB Ectopic Multiple Live Births   3 3     5      Review of Systems  Constitutional: Positive for appetite change, chills and fatigue. Negative for fever.  HENT: Positive for postnasal drip and sore throat. Negative for congestion, ear pain, facial swelling, mouth sores, rhinorrhea, sinus pain, sinus pressure, sneezing and trouble swallowing.   Eyes: Negative for discharge and itching.  Respiratory: Positive for cough. Negative for chest tightness, shortness of breath and wheezing.   Cardiovascular: Negative for chest pain.  Gastrointestinal: Positive for nausea. Negative for abdominal pain, diarrhea and vomiting.   Genitourinary: Positive for frequency.  Musculoskeletal: Positive for arthralgias and myalgias. Negative for neck pain and neck stiffness.  Skin: Negative for rash and wound.  Neurological: Positive for headaches. Negative for dizziness, syncope, weakness and numbness.  Hematological: Negative for adenopathy.    Allergies  Patient has no known allergies.  Home Medications   Prior to Admission medications   Medication Sig Start Date End Date Taking? Authorizing Provider  ibuprofen (ADVIL,MOTRIN) 600 MG tablet Take 1 tablet (600 mg total) by mouth every 6 (six) hours as needed for fever or moderate pain. 10/09/16   Sudie GrumblingAmyot, Jaizon Deroos Berry, NP  lidocaine (XYLOCAINE) 2 % solution Use as directed 10 mLs in the mouth or throat every 4 (four) hours as needed for mouth pain. 10/09/16   Sudie GrumblingAmyot, Anaiz Qazi Berry, NP  ondansetron (ZOFRAN-ODT) 4 MG disintegrating tablet Take 4 mg by mouth daily as needed for nausea/vomiting. 06/15/16   [provider]  Probiotic Product (PRO-BIOTIC BLEND PO) Take 1 capsule by mouth daily.    [provider]   Meds Ordered and Administered this Visit  Medications - No data to display  BP 99/65 (BP Location: Right Arm)   Pulse 84   Temp 98.5 F (36.9 C) (Oral)   Resp 18   LMP 08/18/2016   SpO2 100%  No data found.   Physical Exam  Constitutional: She is oriented to person, place, and time. She appears well-developed  and well-nourished. She appears ill. No distress.  HENT:  Head: Normocephalic and atraumatic.  Right Ear: Hearing, tympanic membrane, external ear and ear canal normal.  Left Ear: Hearing, tympanic membrane, external ear and ear canal normal.  Nose: Nose normal. Right sinus exhibits no maxillary sinus tenderness and no frontal sinus tenderness. Left sinus exhibits no maxillary sinus tenderness and no frontal sinus tenderness.  Mouth/Throat: Uvula is midline and mucous membranes are normal. Oropharyngeal exudate (white) and posterior  oropharyngeal erythema present.  Neck: Normal range of motion. Neck supple.  Cardiovascular: Normal rate, regular rhythm and normal heart sounds.   Pulmonary/Chest: Effort normal and breath sounds normal. No respiratory distress. She has no decreased breath sounds. She has no wheezes. She has no rhonchi.  Musculoskeletal: Normal range of motion.  Lymphadenopathy:    She has no cervical adenopathy.  Neurological: She is alert and oriented to person, place, and time.  Skin: Skin is warm and dry.  Psychiatric: She has a normal mood and affect. Her behavior is normal. Judgment and thought content normal.    Urgent Care Course     Procedures (including critical care time)  Labs Review Labs Reviewed  POCT RAPID STREP A    Imaging Review No results found.   Visual Acuity Review  Right Eye Distance:   Left Eye Distance:   Bilateral Distance:    Right Eye Near:   Left Eye Near:    Bilateral Near:         MDM   1. Pharyngitis, unspecified etiology    Reviewed negative rapid strep test with patient. Discussed that she probably has a viral illness. Recommend take Ibuprofen 600mg  every 6 to 8 hours as needed for pain. May use Lidocaine mouth wash- swish and spit out every 4 hours as needed. Rest. Increase fluid intake. Note written for work. Follow-up with her primary care provider in 3 to 4 days if not improving.     Sudie Grumbling, NP 10/09/16 [phone]

## 2016-10-09 NOTE — Discharge Instructions (Signed)
Recommend take Ibuprofen 600mg  every 6 to 8 hours as needed for body aches and sore throat. May use lidocaine mouth wash- swish and spit out every 4 hours as needed. Rest. Increase fluid intake. Follow-up with your primary care provider in 3 to 4 days if not improving.

## 2016-10-09 NOTE — ED Triage Notes (Signed)
Sore throat and body aches that started last night.

## 2016-10-12 LAB — CULTURE, GROUP A STREP (THRC)

## 2017-07-15 ENCOUNTER — Inpatient Hospital Stay (HOSPITAL_COMMUNITY)
Admission: AD | Admit: 2017-07-15 | Discharge: 2017-07-15 | Disposition: A | Discharge status: Home / Self Care | Payer: Self-pay | Source: Ambulatory Visit | Attending: Obstetrics & Gynecology | Admitting: Obstetrics & Gynecology

## 2017-07-15 ENCOUNTER — Encounter (HOSPITAL_COMMUNITY): Payer: Self-pay | Admitting: *Deleted

## 2017-07-15 DIAGNOSIS — F17210 Nicotine dependence, cigarettes, uncomplicated: Secondary | ICD-10-CM | POA: Insufficient documentation

## 2017-07-15 DIAGNOSIS — Z8249 Family history of ischemic heart disease and other diseases of the circulatory system: Secondary | ICD-10-CM | POA: Insufficient documentation

## 2017-07-15 DIAGNOSIS — R300 Dysuria: Secondary | ICD-10-CM

## 2017-07-15 DIAGNOSIS — Z9851 Tubal ligation status: Secondary | ICD-10-CM | POA: Insufficient documentation

## 2017-07-15 DIAGNOSIS — B9689 Other specified bacterial agents as the cause of diseases classified elsewhere: Secondary | ICD-10-CM | POA: Insufficient documentation

## 2017-07-15 DIAGNOSIS — F1210 Cannabis abuse, uncomplicated: Secondary | ICD-10-CM | POA: Insufficient documentation

## 2017-07-15 DIAGNOSIS — Z87440 Personal history of urinary (tract) infections: Secondary | ICD-10-CM | POA: Insufficient documentation

## 2017-07-15 DIAGNOSIS — Z3202 Encounter for pregnancy test, result negative: Secondary | ICD-10-CM | POA: Insufficient documentation

## 2017-07-15 DIAGNOSIS — A599 Trichomoniasis, unspecified: Secondary | ICD-10-CM

## 2017-07-15 DIAGNOSIS — A5901 Trichomonal vulvovaginitis: Secondary | ICD-10-CM | POA: Insufficient documentation

## 2017-07-15 DIAGNOSIS — Z9889 Other specified postprocedural states: Secondary | ICD-10-CM | POA: Insufficient documentation

## 2017-07-15 HISTORY — DX: Depression, unspecified: F32.A

## 2017-07-15 HISTORY — DX: Anxiety disorder, unspecified: F41.9

## 2017-07-15 HISTORY — DX: Major depressive disorder, single episode, unspecified: F32.9

## 2017-07-15 LAB — POCT PREGNANCY, URINE: Preg Test, Ur: NEGATIVE

## 2017-07-15 LAB — URINALYSIS, ROUTINE W REFLEX MICROSCOPIC
Bilirubin Urine: NEGATIVE
GLUCOSE, UA: NEGATIVE mg/dL
Hgb urine dipstick: NEGATIVE
Ketones, ur: NEGATIVE mg/dL
LEUKOCYTES UA: NEGATIVE
NITRITE: NEGATIVE
Protein, ur: NEGATIVE mg/dL
SPECIFIC GRAVITY, URINE: 1.016 (ref 1.005–1.030)
pH: 6 (ref 5.0–8.0)

## 2017-07-15 LAB — WET PREP, GENITAL
Sperm: NONE SEEN
YEAST WET PREP: NONE SEEN

## 2017-07-15 MED ORDER — METRONIDAZOLE 500 MG PO TABS
2000.0000 mg | ORAL_TABLET | Freq: Once | ORAL | Status: AC
  Administered 2017-07-15: 2000 mg via ORAL
  Filled 2017-07-15: qty 4

## 2017-07-15 MED ORDER — KETOROLAC TROMETHAMINE 60 MG/2ML IM SOLN
60.0000 mg | Freq: Once | INTRAMUSCULAR | Status: AC
  Administered 2017-07-15: 60 mg via INTRAMUSCULAR
  Filled 2017-07-15: qty 2

## 2017-07-15 NOTE — MAU Note (Signed)
Pt reports her period is about 3 weeks late, had a negative home preg test. Has been having burning with urination off and on for 3 months. Denies fever.

## 2017-07-15 NOTE — Discharge Instructions (Signed)
In late 2019, the [facility] will be moving to the [facility] campus. At that time, the MAU (Maternity Admissions Unit), where you are being seen today, will no longer take care of non-pregnant patients. We strongly encourage you to find a doctor's office before that time, so that you can be seen with any GYN concerns, like vaginal discharge, urinary tract infection, etc.. in a timely manner.  In order to make an office visit more convenient, the Center for [facility] at [facility] will be offering evening hours with same-day appointments, walk-in appointments and scheduled appointments available during this time.  Center for [facility] @ [facility] Hours: Monday - 8am - 7:30 pm with walk-in between 4pm- 7:30 pm Tuesday - 8 am - 5 pm (starting 08/18/17 we will be open late and accepting walk-ins from 4pm - 7:30pm) Wednesday - 8 am - 5 pm (starting 11/18/17 we will be open late and accepting walk-ins from 4pm - 7:30pm) Thursday 8 am - 5 pm (starting 02/18/18 we will be open late and accepting walk-ins from 4pm - 7:30pm) Friday 8 am - 5 pm  For an appointment please call the [facility] for [facility] @ [facility] at [phone]  For urgent needs, [facility] is also available for management of urgent GYN complaints such as vaginal discharge or urinary tract infections.       Trichomonas Test Why am I having this test? The trichomonas test is done to diagnose trichomoniasis, an infection caused by an organism called Trichomonas. Trichomoniasis is a sexually transmitted infection (STI). In women, it causes vaginal infections. In men, it can cause the tube that carries urine (urethra) to become inflamed (urethritis). You may have this test as a part of a routine screening for STIs or if you have symptoms of trichomoniasis. To perform the test, your health care provider will take a sample of discharge. The sample is taken from the vagina or cervix  in women and from the urethra in men. A urine sample can also be used for testing. What do the results mean? It is your responsibility to obtain your test results. Ask the lab or department performing the test when and how you will get your results. Contact your health care provider to discuss any questions you have about your results. Negative test results A negative test means you do not have trichomoniasis. Follow your health care provider's directions about any follow-up testing. Positive test results A positive test result means you have an active infection that needs to be treated with antibiotic medicine. All your current sexual partners must also be treated or it is likely you will get reinfected. If your test is positive, your health care provider will start you on medicine and may advise you to:  Not have sexual intercourse until your infection has cleared up.  Use a latex condom properly every time you have sexual intercourse.  Limit the number of sexual partners you have. The more partners you have, the greater your risk of contracting trichomoniasis or another STI.  Tell all sexual partners about your infection so they can also be treated and to prevent reinfection.  Talk with your health care provider to discuss your results, treatment options, and if necessary, the need for more tests. Talk with your health care provider if you have any questions about your results. This information is not intended to replace advice given to you by your health care provider. Make sure you discuss any questions you  have with your health care provider. Document Released: 06/07/2004 Document Revised: 12/05/2015 Document Reviewed: 05/17/2013 Elsevier Interactive Patient Education  2017 [employer].

## 2017-07-15 NOTE — MAU Provider Note (Signed)
History     CSN: [id number]  Arrival date and time: 07/15/17 1304   First Provider Initiated Contact with Patient 07/15/17 1542      Chief Complaint  Patient presents with  . Possible Pregnancy  . Abdominal Pain   HPI   [name] is a 47 y.o. female [phone] [id number] non-pregnant female here in MAU with dysuria. Says she has had the symptoms for 3 months. New sexual partner; not using protection or birth control. Had tubal ligation. Discharge is a light brown in color.  Denies fever.   OB History    Gravida Para Term Preterm AB Living   11 5 5   6 5    SAB TAB Ectopic Multiple Live Births   3 3     5       Past Medical History:  Diagnosis Date  . Anemia   . Anxiety   . Depression   . Infection    UTI  . Pancreatic cyst     Past Surgical History:  Procedure Laterality Date  . DILATION AND CURETTAGE OF UTERUS    . THERAPEUTIC ABORTION     multiple  . TUBAL LIGATION      Family History  Problem Relation Age of Onset  . Hypertension Father     Social History   Tobacco Use  . Smoking status: Current Every Day Smoker    Packs/day: 0.25    Years: 12.00    Pack years: 3.00    Types: Cigarettes  . Smokeless tobacco: Never Used  Substance Use Topics  . Alcohol use: Yes    Comment: occasional  . Drug use: Yes    Types: Marijuana    Comment: Smokes marijuana "all day every day"    Allergies: No Known Allergies  No medications prior to admission.   Results for orders placed or performed during the hospital encounter of 07/15/17 (from the past 48 hour(s))  Urinalysis, Routine w reflex microscopic     Status: None   Collection Time: 07/15/17  2:05 PM  Result Value Ref Range   Color, Urine YELLOW YELLOW   APPearance CLEAR CLEAR   Specific Gravity, Urine 1.016 1.005 - 1.030   pH 6.0 5.0 - 8.0   Glucose, UA NEGATIVE NEGATIVE mg/dL   Hgb urine dipstick NEGATIVE NEGATIVE   Bilirubin Urine NEGATIVE NEGATIVE   Ketones, ur NEGATIVE NEGATIVE mg/dL    Protein, ur NEGATIVE NEGATIVE mg/dL   Nitrite NEGATIVE NEGATIVE   Leukocytes, UA NEGATIVE NEGATIVE    Comment: Performed at [facility], [address] [place], [place] [address]  Pregnancy, urine POC     Status: None   Collection Time: 07/15/17  2:14 PM  Result Value Ref Range   Preg Test, Ur NEGATIVE NEGATIVE    Comment:        THE SENSITIVITY OF THIS METHODOLOGY IS >24 mIU/mL   Wet prep, genital     Status: Abnormal   Collection Time: 07/15/17  3:15 PM  Result Value Ref Range   Yeast Wet Prep HPF POC NONE SEEN NONE SEEN   Trich, Wet Prep PRESENT (A) NONE SEEN   Clue Cells Wet Prep HPF POC PRESENT (A) NONE SEEN   WBC, Wet Prep HPF POC FEW (A) NONE SEEN    Comment: MANY BACTERIA SEEN   Sperm NONE SEEN     Comment: Performed at [facility], [address] [place], [place] [address]    Review of Systems  Constitutional: Negative for fever.  Genitourinary: Positive for  dysuria. Negative for dyspareunia.   Physical Exam   Blood pressure 109/64, pulse 65, temperature 97.8 F (36.6 C), temperature source Oral, resp. rate 15, height 5\' 7"  (1.702 m), weight 144 lb (65.3 kg), last menstrual period 05/23/2017, SpO2 100 %.  Physical Exam  Constitutional: She is oriented to person, place, and time. She appears well-developed and well-nourished. No distress.  HENT:  Head: Normocephalic.  Eyes: Pupils are equal, round, and reactive to light.  GI: Normal appearance. There is tenderness in the suprapubic area. There is no rigidity and no guarding.  Genitourinary:  Genitourinary Comments: Bimanual exam: Cervix closed,  No CMT  Uterus non tender, normal size Adnexa non tender, no masses bilaterally GC/Chlam, wet prep done Chaperone present for exam.   Musculoskeletal: Normal range of motion.  Neurological: She is alert and oriented to person, place, and time.  Skin: Skin is warm. She is not diaphoretic.  Psychiatric: Her behavior is normal.   MAU Course  Procedures   None  MDM  GC, wet prep Patient declined HIV Toradol 60 mg IM Flagyl 2 grams PO  Assessment and Plan   A:  1. Dysuria   2. Trichomonas infection     P:  Discharge home in stable condition  Partner needs treatment, No intercourse for 7 days once partner is treated Condoms always GC pending Return to MAU for emergencies only  Rasch, Harolyn Rutherford, NP 07/15/2017 6:13 PM

## 2017-07-16 LAB — GC/CHLAMYDIA PROBE AMP (~~LOC~~) NOT AT ARMC
Chlamydia: NEGATIVE
Neisseria Gonorrhea: NEGATIVE

## 2017-08-23 ENCOUNTER — Encounter (HOSPITAL_COMMUNITY): Payer: Self-pay | Admitting: *Deleted

## 2017-08-23 ENCOUNTER — Other Ambulatory Visit: Payer: Self-pay

## 2017-08-23 ENCOUNTER — Emergency Department (HOSPITAL_COMMUNITY): Payer: Self-pay

## 2017-08-23 ENCOUNTER — Emergency Department (HOSPITAL_COMMUNITY)
Admission: EM | Admit: 2017-08-23 | Discharge: 2017-08-23 | Disposition: A | Discharge status: Home / Self Care | Payer: Self-pay | Attending: Emergency Medicine | Admitting: Emergency Medicine

## 2017-08-23 DIAGNOSIS — R0602 Shortness of breath: Secondary | ICD-10-CM | POA: Insufficient documentation

## 2017-08-23 DIAGNOSIS — Z77120 Contact with and (suspected) exposure to mold (toxic): Secondary | ICD-10-CM | POA: Insufficient documentation

## 2017-08-23 DIAGNOSIS — F17210 Nicotine dependence, cigarettes, uncomplicated: Secondary | ICD-10-CM | POA: Insufficient documentation

## 2017-08-23 LAB — BASIC METABOLIC PANEL
Anion gap: 10 (ref 5–15)
BUN: 6 mg/dL (ref 6–20)
CO2: 20 mmol/L — ABNORMAL LOW (ref 22–32)
CREATININE: 0.93 mg/dL (ref 0.44–1.00)
Calcium: 9.1 mg/dL (ref 8.9–10.3)
Chloride: 105 mmol/L (ref 101–111)
GFR calc Af Amer: >60 mL/min (ref >60)
GLUCOSE: 84 mg/dL (ref 65–99)
POTASSIUM: 3.9 mmol/L (ref 3.5–5.1)
Sodium: 135 mmol/L (ref 135–145)

## 2017-08-23 LAB — I-STAT TROPONIN, ED: Troponin i, poc: 0 ng/mL (ref 0.00–0.08)

## 2017-08-23 LAB — CBC
HEMATOCRIT: 39.4 % (ref 36.0–46.0)
Hemoglobin: 13.5 g/dL (ref 12.0–15.0)
MCH: 32.3 pg (ref 26.0–34.0)
MCHC: 34.3 g/dL (ref 30.0–36.0)
MCV: 94.3 fL (ref 78.0–100.0)
PLATELETS: 230 10*3/uL (ref 150–400)
RBC: 4.18 MIL/uL (ref 3.87–5.11)
RDW: 13 % (ref 11.5–15.5)
WBC: 4.8 10*3/uL (ref 4.0–10.5)

## 2017-08-23 LAB — I-STAT BETA HCG BLOOD, ED (MC, WL, AP ONLY)

## 2017-08-23 NOTE — ED Provider Notes (Signed)
[facility]   CSN: [id number] Arrival date & time: 08/23/17  1849     History   Chief Complaint Chief Complaint  Patient presents with  . Shortness of Breath    HPI Diana Oconnell is a 47 y.o. female presenting with concerns that she may have been exposed to mold in her appartement. She noticed a brown stain in her bathroom on Monday which grew larger until the wall clappsed today. She reports a very strong foul smell that made her cough and felt short of breath. She sprayed lysol but the smell was too strong. She also reports fevers and chills.  HPI  Past Medical History:  Diagnosis Date  . Anemia   . Anxiety   . Depression   . Infection    UTI  . Pancreatic cyst     There are no active problems to display for this patient.   Past Surgical History:  Procedure Laterality Date  . DILATION AND CURETTAGE OF UTERUS    . THERAPEUTIC ABORTION     multiple  . TUBAL LIGATION       OB History    Gravida  12   Para  5   Term  5   Preterm      AB  6   Living  5     SAB  3   TAB  3   Ectopic      Multiple      Live Births  5            Home Medications    Prior to Admission medications   Not on File    Family History Family History  Problem Relation Age of Onset  . Hypertension Father     Social History Social History   Tobacco Use  . Smoking status: Current Every Day Smoker    Packs/day: 0.25    Years: 12.00    Pack years: 3.00    Types: Cigarettes  . Smokeless tobacco: Never Used  Substance Use Topics  . Alcohol use: Yes    Comment: occasional  . Drug use: Yes    Types: Marijuana    Comment: Smokes marijuana "all day every day"     Allergies   Patient has no known allergies.   Review of Systems Review of Systems  Constitutional: Positive for chills and fever.  Respiratory: Positive for cough and shortness of breath. Negative for choking, chest tightness,  wheezing and stridor.   Cardiovascular: Negative for chest pain and palpitations.  Gastrointestinal: Negative for abdominal pain, diarrhea, nausea and vomiting.  Musculoskeletal: Positive for myalgias. Negative for neck pain.  Skin: Negative for color change, pallor and rash.  Neurological: Negative for dizziness, weakness, light-headedness and numbness.     Physical Exam Updated Vital Signs BP 101/69   Pulse 70   Temp 99.1 F (37.3 C) (Oral)   Resp 15   LMP 08/13/2017   SpO2 97%   Physical Exam  Constitutional: She is oriented to person, place, and time. She appears well-developed and well-nourished.  Non-toxic appearance. She does not appear ill. No distress.  Well-appearing, afebrile nontoxic sitting comfortably in bed no acute distress.  HENT:  Head: Normocephalic and atraumatic.  Mouth/Throat: Oropharynx is clear and moist. No oropharyngeal exudate or posterior oropharyngeal edema.  Eyes: Conjunctivae and EOM are normal.  Neck: Normal range of motion. Neck supple.  Cardiovascular: Normal rate, regular rhythm and normal heart sounds.  No murmur heard. Pulmonary/Chest:  Effort normal and breath sounds normal. No accessory muscle usage. No tachypnea. No respiratory distress. She has no decreased breath sounds.  Lungs are clear to auscultation bilaterally  Abdominal: She exhibits no distension.  Musculoskeletal: Normal range of motion. She exhibits no edema.       Right lower leg: Normal.       Left lower leg: Normal.  Neurological: She is alert and oriented to person, place, and time.  Skin: Skin is warm and dry. No rash noted. She is not diaphoretic. No erythema. No pallor.  Psychiatric: She has a normal mood and affect.  Nursing note and vitals reviewed.    ED Treatments / Results  Labs (all labs ordered are listed, but only abnormal results are displayed) Labs Reviewed  BASIC METABOLIC PANEL - Abnormal; Notable for the following components:      Result Value   CO2  20 (*)    All other components within normal limits  CBC  I-STAT TROPONIN, ED  I-STAT BETA HCG BLOOD, ED ([facility], [facility], [facility] ONLY)    EKG None  Radiology Dg Chest 2 View  Result Date: 08/23/2017 CLINICAL DATA:  Shortness of Breath EXAM: CHEST - 2 VIEW COMPARISON:  09/11/2016 FINDINGS: The heart size and mediastinal contours are within normal limits. Both lungs are clear. The visualized skeletal structures are unremarkable. IMPRESSION: No active cardiopulmonary disease. Electronically Signed   By: Alcide Clever M.D.   On: 08/23/2017 19:50    Procedures Procedures (including critical care time)  Medications Ordered in ED Medications - No data to display   Initial Impression / Assessment and Plan / ED Course  I have reviewed the triage vital signs and the nursing notes.  Pertinent labs & imaging results that were available during my care of the patient were reviewed by me and considered in my medical decision making (see chart for details).    Patient presents with concerns that she may have been exposed to mold after a leak in her apartment which came through the wall this week.   Also reports associated URI symptoms.  Patient is well-appearing, nontoxic afebrile.  Lungs are clear to auscultation bilaterally.  EKG unremarkable, labs unremarkable, negative chest x-ray.  Urged patient to discuss with her landlord repairs and to stay out of the environment at all possible.  She states that her landlord had mentioned that she would move her to a different appartement.  Discharge home with close follow up with PCP.  Discussed strict return precautions and advised to return to the emergency department if experiencing any new or worsening symptoms. Instructions were understood and patient agreed with discharge plan.  Final Clinical Impressions(s) / ED Diagnoses   Final diagnoses:  SOB (shortness of breath)  Suspected exposure to mold    ED Discharge Orders    None       Gregary Cromer 08/23/17 2154    Nira Conn, MD 08/24/17 1556

## 2017-08-23 NOTE — ED Notes (Addendum)
Pt given [place] sandwich and gingerale. Pt verbalized understanding of all d/c instructions, prescriptions, and f/u information. Opportunity for questioning and answers provided. VS WDL. All belongings with patient at this time. Pt ambulatory to lobby with steady gait.

## 2017-08-23 NOTE — ED Triage Notes (Signed)
Pt started having SOB today after returning home, reports feeling fine prior to going home. Pt brought a piece of wood in from her bathroom for concerns of mildew exposure. C/o weakness and generalized body aches that also started today after having sob

## 2017-08-23 NOTE — Discharge Instructions (Signed)
As discussed, try to stay out of the offending environment until your landlord can perform the repairs.  Get plenty of fresh air.  Follow-up with your primary care provider.  Return if symptoms worsen or new concerning symptoms in the meantime.

## 2017-09-20 ENCOUNTER — Ambulatory Visit (HOSPITAL_COMMUNITY)
Admission: EM | Admit: 2017-09-20 | Discharge: 2017-09-20 | Disposition: A | Discharge status: Home / Self Care | Payer: Medicaid Other | Attending: Emergency Medicine | Admitting: Emergency Medicine

## 2017-09-20 ENCOUNTER — Encounter (HOSPITAL_COMMUNITY): Payer: Self-pay | Admitting: Emergency Medicine

## 2017-09-20 DIAGNOSIS — S3095XA Unspecified superficial injury of vagina and vulva, initial encounter: Secondary | ICD-10-CM

## 2017-09-20 DIAGNOSIS — T304 Corrosion of unspecified body region, unspecified degree: Secondary | ICD-10-CM

## 2017-09-20 MED ORDER — SILVER SULFADIAZINE 1 % EX CREA: 1.0000 "application " | TOPICAL_CREAM | Freq: Every day | CUTANEOUS | 0 refills | Status: DC

## 2017-09-20 NOTE — Discharge Instructions (Signed)
Please use Silvadene cream twice daily for the next week, expect your skin irritation to slowly heal on its own and repair as the skin or places itself over the next couple weeks  He may continue to take a daily a Zyrtec/Claritin or Benadryl as needed  Please avoid using Darene Lamer in this area in the future, please use warm compresses/hot bath soaks for further management of ingrown hairs.

## 2017-09-20 NOTE — ED Triage Notes (Signed)
Pt states she had an ingrown hair on her vagina that she used some checmicals and some nair on it and the skin is irritated.

## 2017-09-21 NOTE — ED Provider Notes (Signed)
[facility]    CSN: [id number] Arrival date & time: 09/20/17  1839     History   Chief Complaint Chief Complaint  Patient presents with  . Rash    HPI Diana Oconnell is a 47 y.o. female presenting today for evaluation of skin irritation to her genital area.  Patient states that she frequently struggles with ingrown hair/skin bumps related to shaving.  She typically applies a charcoal facial mask on these areas in her groin which typically helps.  Today she also used Darene Lamer to her genital area and subsequently developed changes in her skin as well as a burning sensation that is persisted.  She rinsed the area, washed it off and try to remove the nare as much as she could.  She has applied Vaseline but is still having significant burning to the area.  HPI  Past Medical History:  Diagnosis Date  . Anemia   . Anxiety   . Depression   . Infection    UTI  . Pancreatic cyst     There are no active problems to display for this patient.   Past Surgical History:  Procedure Laterality Date  . DILATION AND CURETTAGE OF UTERUS    . THERAPEUTIC ABORTION     multiple  . TUBAL LIGATION      OB History    Gravida  12   Para  5   Term  5   Preterm      AB  6   Living  5     SAB  3   TAB  3   Ectopic      Multiple      Live Births  5            Home Medications    Prior to Admission medications   Medication Sig Start Date End Date Taking? Authorizing Provider  silver sulfADIAZINE (SILVADENE) 1 % cream Apply 1 application topically daily. 09/20/17   Wieters, Junius Creamer, PA-C    Family History Family History  Problem Relation Age of Onset  . Hypertension Father     Social History Social History   Tobacco Use  . Smoking status: Current Every Day Smoker    Packs/day: 0.25    Years: 12.00    Pack years: 3.00    Types: Cigarettes  . Smokeless tobacco: Never Used  Substance Use Topics  . Alcohol use: Yes    Comment: occasional  . Drug  use: Yes    Types: Marijuana    Comment: Smokes marijuana "all day every day"     Allergies   Patient has no known allergies.   Review of Systems Review of Systems  Constitutional: Negative for fatigue and fever.  Eyes: Negative for visual disturbance.  Respiratory: Negative for shortness of breath.   Cardiovascular: Negative for chest pain.  Gastrointestinal: Negative for nausea and vomiting.  Genitourinary: Negative for dysuria, frequency, genital sores, hematuria, menstrual problem, pelvic pain, vaginal bleeding, vaginal discharge and vaginal pain.  Musculoskeletal: Negative for arthralgias and myalgias.  Skin: Positive for color change and rash. Negative for pallor and wound.  Neurological: Negative for dizziness, weakness, light-headedness, numbness and headaches.     Physical Exam Triage Vital Signs ED Triage Vitals [09/20/17 1923]  Enc Vitals Group     BP 115/87     Pulse Rate 77     Resp 16     Temp (!) 97.5 F (36.4 C)     Temp Source Oral  SpO2 97 %     Weight      Height      Head Circumference      Peak Flow      Pain Score      Pain Loc      Pain Edu?      Excl. in GC?    No data found.  Updated Vital Signs BP 115/87 (BP Location: Left Arm)   Pulse 77   Temp (!) 97.5 F (36.4 C) (Oral)   Resp 16   LMP 09/10/2017   SpO2 97%   Breastfeeding? Unknown   Visual Acuity Right Eye Distance:   Left Eye Distance:   Bilateral Distance:    Right Eye Near:   Left Eye Near:    Bilateral Near:     Physical Exam  Constitutional: She appears well-developed and well-nourished. No distress.  HENT:  Head: Normocephalic and atraumatic.  Eyes: Conjunctivae are normal.  Neck: Neck supple.  Cardiovascular: Normal rate.  Pulmonary/Chest: Effort normal. No respiratory distress.  Abdominal: Soft. There is no tenderness.  Genitourinary:  Genitourinary Comments: Normal female external genitalia, hypopigmentation to superior aspect of mons on left side,  erythema to left labium majora extending to left groin crease, small areas of hyperpigmentation resembling ingrown hairs/razor bumps., no signs of's skin breakdown, no erythema or rash present to internal labia majora/labia minora.  Musculoskeletal: She exhibits no edema.  Neurological: She is alert.  Skin: Skin is warm and dry. There is erythema.  Psychiatric: She has a normal mood and affect.  Nursing note and vitals reviewed.    UC Treatments / Results  Labs (all labs ordered are listed, but only abnormal results are displayed) Labs Reviewed - No data to display  EKG None  Radiology No results found.  Procedures Procedures (including critical care time)  Medications Ordered in UC Medications - No data to display  Initial Impression / Assessment and Plan / UC Course  I have reviewed the triage vital signs and the nursing notes.  Pertinent labs & imaging results that were available during my care of the patient were reviewed by me and considered in my medical decision making (see chart for details).     Patient with possible chemical burn related to charcoal mask and Darene Lamer.  Skin does appear intact.  Will apply Silvadene cream and send in.  Discussed that this should slowly heal over time as the skin or places itself.  May also try antihistamines including Benadryl and Zyrtec for any allergic reaction that may be involved/underlying. Discussed strict return precautions. Patient verbalized understanding and is agreeable with plan.  Final Clinical Impressions(s) / UC Diagnoses   Final diagnoses:  Chemical burn     Discharge Instructions     Please use Silvadene cream twice daily for the next week, expect your skin irritation to slowly heal on its own and repair as the skin or places itself over the next couple weeks  He may continue to take a daily a Zyrtec/Claritin or Benadryl as needed  Please avoid using Darene Lamer in this area in the future, please use warm compresses/hot  bath soaks for further management of ingrown hairs.   ED Prescriptions    Medication Sig Dispense Auth. Provider   silver sulfADIAZINE (SILVADENE) 1 % cream Apply 1 application topically daily. 85 g Wieters, [place] C, PA-C     Controlled Substance Prescriptions [place] Controlled Substance Registry consulted? Not Applicable   Lew Dawes, [place] 09/21/17 [phone]

## 2017-12-15 ENCOUNTER — Inpatient Hospital Stay (HOSPITAL_COMMUNITY)
Admission: AD | Admit: 2017-12-15 | Discharge: 2017-12-15 | Disposition: A | Discharge status: Home / Self Care | Payer: Medicaid Other | Source: Ambulatory Visit | Attending: Family Medicine | Admitting: Family Medicine

## 2017-12-15 ENCOUNTER — Encounter (HOSPITAL_COMMUNITY): Payer: Self-pay

## 2017-12-15 DIAGNOSIS — F17210 Nicotine dependence, cigarettes, uncomplicated: Secondary | ICD-10-CM | POA: Insufficient documentation

## 2017-12-15 DIAGNOSIS — J029 Acute pharyngitis, unspecified: Secondary | ICD-10-CM | POA: Insufficient documentation

## 2017-12-15 LAB — URINALYSIS, ROUTINE W REFLEX MICROSCOPIC
BACTERIA UA: NONE SEEN
Bilirubin Urine: NEGATIVE
Glucose, UA: NEGATIVE mg/dL
Ketones, ur: NEGATIVE mg/dL
Leukocytes, UA: NEGATIVE
NITRITE: NEGATIVE
PH: 7 (ref 5.0–8.0)
Protein, ur: NEGATIVE mg/dL
SPECIFIC GRAVITY, URINE: 1.016 (ref 1.005–1.030)

## 2017-12-15 LAB — INFLUENZA PANEL BY PCR (TYPE A & B)
INFLBPCR: NEGATIVE
Influenza A By PCR: NEGATIVE

## 2017-12-15 LAB — GROUP A STREP BY PCR: Group A Strep by PCR: NOT DETECTED

## 2017-12-15 MED ORDER — IBUPROFEN 800 MG PO TABS
800.0000 mg | ORAL_TABLET | Freq: Once | ORAL | Status: AC
  Administered 2017-12-15: 800 mg via ORAL
  Filled 2017-12-15: qty 1

## 2017-12-15 MED ORDER — ONDANSETRON 4 MG PO TBDP: 4.0000 mg | ORAL_TABLET | Freq: Three times a day (TID) | ORAL | 0 refills | Status: DC | PRN

## 2017-12-15 MED ORDER — ONDANSETRON 8 MG PO TBDP
8.0000 mg | ORAL_TABLET | Freq: Once | ORAL | Status: AC
Start: 2017-12-15 — End: 2017-12-15
  Administered 2017-12-15: 8 mg via ORAL
  Filled 2017-12-15: qty 1

## 2017-12-15 MED ORDER — ACETAMINOPHEN 500 MG PO TABS
1000.0000 mg | ORAL_TABLET | Freq: Once | ORAL | Status: AC
  Administered 2017-12-15: 1000 mg via ORAL
  Filled 2017-12-15: qty 2

## 2017-12-15 MED ORDER — PROMETHAZINE HCL 25 MG/ML IJ SOLN
25.0000 mg | Freq: Once | INTRAMUSCULAR | Status: AC
  Administered 2017-12-15: 25 mg via INTRAMUSCULAR
  Filled 2017-12-15: qty 1

## 2017-12-15 NOTE — Discharge Instructions (Signed)
Pharyngitis Pharyngitis is redness, pain, and swelling (inflammation) of the throat (pharynx). It is a very common cause of sore throat. Pharyngitis can be caused by a bacteria, but it is usually caused by a virus. Most cases of pharyngitis get better on their own without treatment. What are the causes? This condition may be caused by:  Infection by viruses (viral). Viral pharyngitis spreads from person to person (is contagious) through coughing, sneezing, and sharing of personal items or utensils such as cups, forks, spoons, and toothbrushes.  Infection by bacteria (bacterial). Bacterial pharyngitis may be spread by touching the nose or face after coming in contact with the bacteria, or through more intimate contact, such as kissing.  Allergies. Allergies can cause buildup of mucus in the throat (post-nasal drip), leading to inflammation and irritation. Allergies can also cause blocked nasal passages, forcing breathing through the mouth, which dries and irritates the throat.  What increases the risk? You are more likely to develop this condition if:  You are 68-53 years old.  You are exposed to crowded environments such as daycare, school, or dormitory living.  You live in a cold climate.  You have a weakened disease-fighting (immune) system.  What are the signs or symptoms? Symptoms of this condition vary by the cause (viral, bacterial, or allergies) and can include:  Sore throat.  Fatigue.  Low-grade fever.  Headache.  Joint pain and muscle aches.  Skin rashes.  Swollen glands in the throat (lymph nodes).  Plaque-like film on the throat or tonsils. This is often a symptom of bacterial pharyngitis.  Vomiting.  Stuffy nose (nasal congestion).  Cough.  Red, itchy eyes (conjunctivitis).  Loss of appetite.  How is this diagnosed? This condition is often diagnosed based on your medical history and a physical exam. Your health care provider will ask you questions about  your illness and your symptoms. A swab of your throat may be done to check for bacteria (rapid strep test). Other lab tests may also be done, depending on the suspected cause, but these are rare. How is this treated? This condition usually gets better in 3-4 days without medicine. Bacterial pharyngitis may be treated with antibiotic medicines. Follow these instructions at home:  Take over-the-counter and prescription medicines only as told by your health care provider. ? If you were prescribed an antibiotic medicine, take it as told by your health care provider. Do not stop taking the antibiotic even if you start to feel better. ? Do not give children aspirin because of the association with Reye syndrome.  Drink enough water and fluids to keep your urine clear or pale yellow.  Get a lot of rest.  Gargle with a salt-water mixture 3-4 times a day or as needed. To make a salt-water mixture, completely dissolve -1 tsp of salt in 1 cup of warm water.  If your health care provider approves, you may use throat lozenges or sprays to soothe your throat. Contact a health care provider if:  You have large, tender lumps in your neck.  You have a rash.  You cough up green, yellow-brown, or bloody spit. Get help right away if:  Your neck becomes stiff.  You drool or are unable to swallow liquids.  You cannot drink or take medicines without vomiting.  You have severe pain that does not go away, even after you take medicine.  You have trouble breathing, and it is not caused by a stuffy nose.  You have new pain and swelling in your joints  such as the knees, ankles, wrists, or elbows. Summary  Pharyngitis is redness, pain, and swelling (inflammation) of the throat (pharynx).  While pharyngitis can be caused by a bacteria, the most common causes are viral.  Most cases of pharyngitis get better on their own without treatment.  Bacterial pharyngitis is treated with antibiotic medicines. This  information is not intended to replace advice given to you by your health care provider. Make sure you discuss any questions you have with your health care provider. Document Released: 05/05/2005 Document Revised: 06/10/2016 Document Reviewed: 06/10/2016 Elsevier Interactive Patient Education  2018 [employer].     [facility] Guide (Revised August 2014)    Insufficient Money for Medicine:           [address]: call "211"    MAP Program at [facility] - [place] [phone] or [place] [phone]            No Primary Care Doctor:  To locate a primary care doctor that accepts your insurance or provides certain services:           [place] Connect: [phone]           Physician Referral Service: [phone] ask for [place]  If no insurance, you need to see if you qualify for [facility] : [facility] orange card, call to set      up appointment for eligibility/enrollment at [phone] or [phone] or visit [facility]. of Health and [employer] ([address], [place] and [address] -[place]) to meet with a [facility] enrollment specialist.  Agencies that provide inexpensive (sliding fee scale) medical care:       [facility] - Family Medicine at [place] - [phone]     [facility]  -  [facility] [phone]     [facility] Internal Medicine - [phone]     [facility] Care & Wellness - [phone]     [facility] for Children [phone]     [facility] [phone]  [facility] - [facility] Child Health @ [place] [phone]  [facility] - [facility] @ [place] - [phone]  [facility] Family [facility]: [phone]   Women's Clinic: [phone]   Planned Parenthood: [phone]   [facility] of the [place]    Medicaid-accepting [facility] Providers:            [facility] [phone] (No Family Planning accepted)          [address], [phone], Mon-Fri 9am-5pm          [facility] - [phone]  [address] [place], [address] [place], Mon-Thursday 8am-5pm, Fri 8am-noon  [employer] - [phone]          [address], Mon-Fri 7:30am-4:30pm          [facility] - [phone]          [address] [place] 8am-5pm          [facility] - [phone] [address]          Only accepts [place] [employer] patients after they have their name applied to their card  Self Pay (  no insurance) in [facility]:           Sickle Cell Patients:   [address] [place], [phone] [facility] Internal Medicine:  [address], [place] [phone]       [facility] and Wellness  [address], [place] [phone]  [facility] Family Practice:  [address] [phone]          [facility]           [address] [place], [phone] [facility]  [address] [place], [phone]           [facility]gent Care [place]           [address], [place] [phone]        [facility] - [address]           [phone], Mon-Fri 9am-7pm, [place] 9am-1pm          [facility] - Family Medicine @ [facility]          [address] [place], [phone]          [facility] - [facility]           [address], [phone] [facility] - [facility]  [address] [place] [phone]          [facility]           [address], [phone]  [facility] - [facility]   [address] [place], [place] [phone] [facility] - [facility]  [address] [phone]  Dr. Julio Sicks           [address], [place], [phone]          [facility]gent Care           [address] [phone]          [facility]             [address], [phone]          [facility]           [address] [place], [phone], 1st & 3rd Saturday every month, 10am-1pm OTHERS:  [employer]  (Immigration [employer] Only)  [phone] (Thursday only) Strategies for finding a Primary Care Provider:  1) Find a Doctor and Pay Out of Pocket  Although you won't have to find out who is covered by your insurance plan, it is a good idea to ask around and get recommendations. You will then need to call the office and see if the doctor you have chosen will accept you as a new patient and what types of options they offer for patients who are self-pay. Some doctors offer discounts or will set up payment plans for their patients who do not have insurance, but you will need to ask so you aren't surprised when you get to your appointment.  2) Contact [place] [employer] - To see if you qualify for orange card access to healthcare safety net providers.  Call for appointment for eligibility/enrollment at [phone] or [phone]. (Uninsured, 0-200% FPL, qualifying info)  Applicants for  GCCN are first required to see if they are eligible to enroll in the [facility] Marketplace before enrolling in [facility] (and get an exemption if they are not).  GCCN Criteria for acceptance is:  ? Proof of ACA Marketing exemption - form or documentation  ? Valid photo ID (driver's license, state identification card, passport, home country ID)  ? Proof of [place] residency (e.g. drivers license, lease/landlord information, pay stubs with address, utility bill, bank statement, etc.)  ? Proof of income (1040, last year's tax return, W2, 4 current pay stubs,  other income proof)  ? Proof of assets (current bank statement + 3 most recent, disability paperwork, life insurance info, tax value on autos, etc.)  3) Contact Your Local Health Department  Not all health departments have doctors that can see patients for sick visits, but many do, so it is worth a call to see if yours does. If you don't know where your local health department is, you can check in your phone book. The CDC also has a tool to help you locate your state's health department, and many state websites also have listings of all of their local health departments.  4) Find a Walk-in Clinic  If your illness is not likely to be very severe or complicated, you may want to try a walk in clinic. These are popping up all over the country in pharmacies, drugstores, and shopping centers. They're usually staffed by nurse practitioners or physician assistants that have been trained to treat common illnesses and complaints. They're usually fairly quick and inexpensive. However, if you have serious medical issues or chronic medical problems, these are probably not your best option   STD Testing:           [facility] of [facility] [place], [place] [facility]           [address], [place], phone [phone] or [phone]           Monday - Friday, call for an appointment          [facility] of [facility] [place], [place] [facility]           [address], [place], phone [phone] or [phone]           Monday - Friday, call for an appointment Abuse/Neglect:           [facility] Child Abuse Hotline: 515-445-4349769-663-2184           [facility] Child Abuse Hotline: 913-082-4000906-556-2662 (After Hours) Emergency Shelter:  [facility] [phone]) [phone]  [facility] [place]- [phone](336) [phone]  [facility] [place] - [phone]) [phone]  Youth Focus - Act Together - [phone]) [phone] (ages [phone])  Homeless Day Shelter @ [employer] - 802-736-4692(336)  [phone]   Mammograms - Free at [facility] - [place] [phone]- [phone]  Maternity Homes:           Room at the [place] [facility]: [phone]) [phone]   (Homeless mother with children)          [facility] Services: [phone](704) [phone] (Mothers only)  [facility]: [phone](336) [phone] (Pregnant 1516-495 years old)  Adopt a Mom -([phone]) [phone]  [facility] Resources   [facility] - Lanae BoastClara F. Gunn  [address], [place] [phone]) [phone]          [facility] [place] [facility]           [address],  [place]           [phone]          [facility]           [address], [place]           [phone]          [facility] Dept.           [address], [place]           [phone]          [facility] Mental Health           [phone]          [facility]           [phone]          [facility] in [place]           [address]           [phone], [facility] Child Abuse Hotline           [phone]           [phone] (After Hours)  Behavioral Health Services /Substance Abuse Resources:           Alcohol and Drug Services: [phone]           [facility]: [phone]          [facility]: [phone]   Narcotics Helpline - [phone]          [facility]: [phone]           Residential & Outpatient Substance Abuse Program - [facility] [place]: [phone]  [employer]  [facility]al [facility] and Wellness Center - [phone] Psychological Services:          [facility] Health: [phone]   Therapeutic Alternatives: [phone]          [facility] Mental Health           [address], [place]           ACCESS LINE: [phone]     (24 Hour)  Mobile Crisis:   [place]INES:  Financial risk analyst on Mental Illness - [place] [phone] [facility] on Mental Illness - [place] [phone]  Walk In [facility] - [address] - [place]  [phone]       [facility] - [phone] or [phone]  [facility] - [phone] [address] - [employer] [phone]  [facility] [phone]. [address], [place] [phone]   Dental Assistance:  If unable to pay or uninsured, contact: [facility]. to become qualified for the adult dental clinic. Patient must be enrolled in [facility] (uninsured, 0-200% FPL, qualifying info).  Enroll in [facility] first, then see Primary Care Physician assigned to you, the PCP makes a dental referral. [place] [facility] will receive referral and contacts patient for appointment.  Patients with Medicaid           [address] [phone]  [facility] [facility] (Children up to 20 + Pregnant Women) - [phone]  [facility] - [address] - [phone]  If unable to pay, or uninsured: contact [facility]  Department [phone] in [place] - (Children only + Pregnant Women), [phone] in [facility] Children only) to become qualified for the adult dental clinic  Must see if eligible to enroll in [facility] Marketplace before enrolling into the [facility] (exemption required) [phone] for an appointment)  [email or web address];   [phone].  If not eligible for ACA, then go by Department of Health and Human Services to see if eligible for orange card.  [address], [place] and [phone]  Once you get an orange card, you will have a Primary Care home who will then refer you to dental if needed.     Other IT consultant:   [facility] [phone] (ext [phone])   [address]  Dr. Brock Mokry [place] - [phone]   [address]    [place] - [phone]   [facility]           [address] [place], [place], [place], [address]           [phone], Ext. [phone]           2nd and 4th Thursday of the month at 6:30am (Simple extractions only - no wisdom teeth or surgery) First come/First serve -First 10 clients served           [facility], [place] and [place] residents only)          [address] [place], [place], [place], [address]           [phone]                    [facility] Health Department           [phone]          [facility] Health Department          [phone]         [facility] Department - [facility]          [phone]   Transportation Options:  Ambulance - 911 - $[phone] per ride Family Member to accompany patient (if stable) [place] [employer] - [phone]  PART - [phone]  Taxi - [phone] - [place]  SCAT - [phone] (Application required)  [facility] - [phone]

## 2017-12-15 NOTE — MAU Provider Note (Signed)
Chief Complaint: Sore Throat; Chills; and Generalized Body Aches   None    SUBJECTIVE HPI: Diana Oconnell is a 47 y.o. [id number] at Unknown who presents to [facility] reporting sore throat, chills, and body aches. Symptoms began yesterday. Does not have a PCP. Has not treated symptoms. 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  Symptoms started with sore throat. Has had chills but didn't check her temperature. Reports pain in her right ear when she swallows. Vomited once today and continues to be nauseated. Denies abdominal pain, cough, SOB, or vaginal complaints.   Location: throat/body aches Quality: shooting/aching Severity: both 10/10 on pain scale Duration: 2 days Timing: constant Modifying factors: swallowing makes throat pain worse. Hasn't treated symptoms.  Associated signs and symptoms: n/v, chills  Past Medical History:  Diagnosis Date  . Anemia   . Anxiety   . Depression   . Infection    UTI  . Pancreatic cyst    OB History  Gravida Para Term Preterm AB Living  12 5 5   6 5   SAB TAB Ectopic Multiple Live Births  3 3     5     # Outcome Date GA Lbr Len/2nd Weight Sex Delivery Anes PTL Lv  12 Gravida           11 TAB           10 TAB           9 TAB           8 SAB           7 SAB           6 SAB           5 Term      Vag-Spont   LIV  4 Term      Vag-Spont  N LIV  3 Term      Vag-Spont  N LIV  2 Term      Vag-Spont  N LIV  1 Term      Vag-Spont  N ND     Birth Comments: anacephalic   Past Surgical History:  Procedure Laterality Date  . DILATION AND CURETTAGE OF UTERUS    . THERAPEUTIC ABORTION     multiple  . TUBAL LIGATION     Social History   Socioeconomic History  . Marital status: Divorced    Spouse name: Not on file  . Number of children: Not on file  . Years of education: Not on file  . Highest education level: Not on file  Occupational History  . Not on file  Social Needs  . Financial resource strain: Not on  file  . Food insecurity:    Worry: Not on file    Inability: Not on file  . Transportation needs:    Medical: Not on file    Non-medical: Not on file  Tobacco Use  . Smoking status: Current Every Day Smoker    Packs/day: 0.25    Years: 12.00    Pack years: 3.00    Types: Cigarettes  . Smokeless tobacco: Never Used  Substance and Sexual Activity  . Alcohol use: Yes    Comment: occasional  . Drug use: Yes    Types: Marijuana    Comment: Smokes marijuana "all day every day"  . Sexual activity: Yes    Birth control/protection: Surgical  Lifestyle  . Physical activity:    Days per week: Not on  file    Minutes per session: Not on file  . Stress: Not on file  Relationships  . Social connections:    Talks on phone: Not on file    Gets together: Not on file    Attends religious service: Not on file    Active member of club or organization: Not on file    Attends meetings of clubs or organizations: Not on file    Relationship status: Not on file  . Intimate partner violence:    Fear of current or ex partner: Not on file    Emotionally abused: Not on file    Physically abused: Not on file    Forced sexual activity: Not on file  Other Topics Concern  . Not on file  Social History Narrative  . Not on file   Family History  Problem Relation Age of Onset  . Hypertension Father    No current facility-administered medications on file prior to encounter.    Current Outpatient Medications on File Prior to Encounter  Medication Sig Dispense Refill  . silver sulfADIAZINE (SILVADENE) 1 % cream Apply 1 application topically daily. 85 g 0   No Known Allergies  I have reviewed patient's Past Medical Hx, Surgical Hx, Family Hx, Social Hx, medications and allergies.   Review of Systems  Constitutional: Positive for chills.  HENT: Positive for ear pain and sore throat. Negative for mouth sores and rhinorrhea.   Respiratory: Negative for cough.   Cardiovascular: Negative for chest  pain.  Gastrointestinal: Positive for diarrhea, nausea and vomiting. Negative for abdominal pain.  Genitourinary: Negative.   Musculoskeletal: Positive for myalgias.    OBJECTIVE Patient Vitals for the past 24 hrs:  BP Temp Temp src Pulse Resp Height Weight  12/15/17 1953 (!) 108/58 (!) 101.1 F (38.4 C) Oral (!) 107 (!) 22 - -  12/15/17 1753 - (!) 102.8 F (39.3 C) - - - - -  12/15/17 1556 113/71 (!) 102.5 F (39.2 C) Oral (!) 115 (!) 22 5\' 7"  (1.702 m) 148 lb (67.1 kg)   Constitutional: Well-developed, well-nourished female in no acute distress.  Cardiovascular: tachycardic. Normal rhythm, no murmur Respiratory: normal rate and effort. Lung sounds clear throughout GI: Abd soft, non-tender, Pos BS x 4. No guarding or rebound tenderness MS: Extremities nontender, no edema, normal ROM Neurologic: Alert and oriented x 4.  HENT: bilateral TMs clear. No sinus tenderness. Tonsillar swelling equal bilaterally and erythema noted throughout but no exudate. Lymph: no lymphadenopathy  LAB RESULTS Results for orders placed or performed during the hospital encounter of 12/15/17 (from the past 24 hour(s))  Urinalysis, Routine w reflex microscopic     Status: Abnormal   Collection Time: 12/15/17  4:09 PM  Result Value Ref Range   Color, Urine YELLOW YELLOW   APPearance CLEAR CLEAR   Specific Gravity, Urine 1.016 1.005 - 1.030   pH 7.0 5.0 - 8.0   Glucose, UA NEGATIVE NEGATIVE mg/dL   Hgb urine dipstick SMALL (A) NEGATIVE   Bilirubin Urine NEGATIVE NEGATIVE   Ketones, ur NEGATIVE NEGATIVE mg/dL   Protein, ur NEGATIVE NEGATIVE mg/dL   Nitrite NEGATIVE NEGATIVE   Leukocytes, UA NEGATIVE NEGATIVE   RBC / HPF 0-5 0 - 5 RBC/hpf   WBC, UA 0-5 0 - 5 WBC/hpf   Bacteria, UA NONE SEEN NONE SEEN   Squamous Epithelial / LPF 0-5 0 - 5  Influenza panel by PCR (type A & B)     Status: None   Collection  Time: 12/15/17  4:17 PM  Result Value Ref Range   Influenza A By PCR NEGATIVE NEGATIVE    Influenza B By PCR NEGATIVE NEGATIVE  Group A Strep by PCR     Status: None   Collection Time: 12/15/17  4:17 PM  Result Value Ref Range   Group A Strep by PCR NOT DETECTED NOT DETECTED    IMAGING No results found.  MAU COURSE Orders Placed This Encounter  Procedures  . Group A Strep by PCR  . Urinalysis, Routine w reflex microscopic  . Influenza panel by PCR (type A & B)  . Discharge patient   Meds ordered this encounter  Medications  . acetaminophen (TYLENOL) tablet 1,000 mg  . ondansetron (ZOFRAN-ODT) disintegrating tablet 8 mg  . ibuprofen (ADVIL,MOTRIN) tablet 800 mg  . promethazine (PHENERGAN) injection 25 mg  . ondansetron (ZOFRAN ODT) 4 MG disintegrating tablet    Sig: Take 1 tablet (4 mg total) by mouth every 8 (eight) hours as needed for nausea or vomiting.    Dispense:  15 tablet    Refill:  0    Order Specific Question:   Supervising Provider    Answer:   Jaynie CollinsANYANWU, UGONNA A [[id number]]    MDM Strep throat & flu swabs collected by RN -- both negative Zofran & tylenol given but patient vomited within 30 minutes of receiving IM phenergan given Pt tolerated oral ibuprofen GC/CT throat swab collect  ASSESSMENT 1. Acute pharyngitis, unspecified etiology     PLAN Discharge home in stable condition. Encouraged patient to establish care with a PCP & reports to Urgent Care if symptoms worsen or don't improve  Follow-up Information    [facility] Follow up.   Specialty:  Urgent Care Why:  If symptoms worsen Contact information: [address] [place] [id number] [phone]         Allergies as of 12/15/2017   No Known Allergies     Medication List    TAKE these medications   ondansetron 4 MG disintegrating tablet Commonly known as:  ZOFRAN ODT Take 1 tablet (4 mg total) by mouth every 8 (eight) hours as needed for nausea or vomiting.   silver sulfADIAZINE 1 % cream Commonly known as:   SILVADENE Apply 1 application topically daily.        Judeth HornLawrence, Shavonte Zhao, NP 12/16/2017  10:34 AM

## 2017-12-15 NOTE — MAU Note (Signed)
Pt C/O throat pain, body aches, chills, unable to eat or drink anything today.  Has vomited twice, had diarrhea once.

## 2017-12-15 NOTE — MAU Note (Signed)
Pt c/o flu like symptoms, fever, chills, sore throat, and N&V.

## 2017-12-16 LAB — GC/CHLAMYDIA PROBE AMP (~~LOC~~) NOT AT ARMC
Chlamydia: NEGATIVE
Neisseria Gonorrhea: NEGATIVE

## 2017-12-18 ENCOUNTER — Other Ambulatory Visit: Payer: Self-pay

## 2017-12-18 ENCOUNTER — Encounter (HOSPITAL_COMMUNITY): Payer: Self-pay

## 2017-12-18 ENCOUNTER — Ambulatory Visit (HOSPITAL_COMMUNITY)
Admission: EM | Admit: 2017-12-18 | Discharge: 2017-12-18 | Disposition: A | Discharge status: Home / Self Care | Payer: Medicaid Other | Attending: Internal Medicine | Admitting: Internal Medicine

## 2017-12-18 DIAGNOSIS — J029 Acute pharyngitis, unspecified: Secondary | ICD-10-CM

## 2017-12-18 DIAGNOSIS — Z8249 Family history of ischemic heart disease and other diseases of the circulatory system: Secondary | ICD-10-CM | POA: Insufficient documentation

## 2017-12-18 DIAGNOSIS — J028 Acute pharyngitis due to other specified organisms: Secondary | ICD-10-CM

## 2017-12-18 DIAGNOSIS — F17210 Nicotine dependence, cigarettes, uncomplicated: Secondary | ICD-10-CM | POA: Insufficient documentation

## 2017-12-18 DIAGNOSIS — R070 Pain in throat: Secondary | ICD-10-CM

## 2017-12-18 LAB — POCT INFECTIOUS MONO SCREEN: Mono Screen: NEGATIVE

## 2017-12-18 LAB — POCT RAPID STREP A: Streptococcus, Group A Screen (Direct): NEGATIVE

## 2017-12-18 MED ORDER — AMOXICILLIN 500 MG PO CAPS: 500.0000 mg | ORAL_CAPSULE | Freq: Two times a day (BID) | ORAL | 0 refills | Status: AC

## 2017-12-18 NOTE — ED Provider Notes (Signed)
[facility]   [id number] 12/18/17 Arrival Time: 4098  SUBJECTIVE: History from: patient.  Diana Oconnell is a 47 y.o. female who presents with abrupt onset of worsening sore throat for 5 days.  Was seen at [facility] on 12/15/17 where strep and flu were negative.  Denies positive sick exposure or precipitating event, but does admit to participating in oral sex prior to symptoms.  Partner with similar symptoms.  Has tried salt water gargles and alternating tylenol/motrin with brief symptomatic relief.  Symptoms are made worse with swallowing, but tolerating liquids and own secretions without difficulty.  Reports previous symptoms in the past and diagnosed with strep.   Complains of fever with Tmax of 102.5 on 12/15/17, afebrile in office, chills, fatigue, and looser stools.  Denies sinus pain, rhinorrhea, cough, SOB, wheezing, chest pain, nausea, rash, or changes in bladder habits.    ROS: As per HPI.  Past Medical History:  Diagnosis Date  . Anemia   . Anxiety   . Depression   . Infection    UTI  . Pancreatic cyst    Past Surgical History:  Procedure Laterality Date  . DILATION AND CURETTAGE OF UTERUS    . THERAPEUTIC ABORTION     multiple  . TUBAL LIGATION     No Known Allergies No current facility-administered medications on file prior to encounter.    No current outpatient medications on file prior to encounter.   Social History   Socioeconomic History  . Marital status: Divorced    Spouse name: Not on file  . Number of children: Not on file  . Years of education: Not on file  . Highest education level: Not on file  Occupational History  . Not on file  Social Needs  . Financial resource strain: Not on file  . Food insecurity:    Worry: Not on file    Inability: Not on file  . Transportation needs:    Medical: Not on file    Non-medical: Not on file  Tobacco Use  . Smoking status: Current Every Day Smoker    Packs/day: 0.25    Years: 12.00      Pack years: 3.00    Types: Cigarettes  . Smokeless tobacco: Never Used  Substance and Sexual Activity  . Alcohol use: Yes    Comment: occasional  . Drug use: Yes    Types: Marijuana    Comment: Smokes marijuana "all day every day"  . Sexual activity: Yes    Birth control/protection: Surgical  Lifestyle  . Physical activity:    Days per week: Not on file    Minutes per session: Not on file  . Stress: Not on file  Relationships  . Social connections:    Talks on phone: Not on file    Gets together: Not on file    Attends religious service: Not on file    Active member of club or organization: Not on file    Attends meetings of clubs or organizations: Not on file    Relationship status: Not on file  . Intimate partner violence:    Fear of current or ex partner: Not on file    Emotionally abused: Not on file    Physically abused: Not on file    Forced sexual activity: Not on file  Other Topics Concern  . Not on file  Social History Narrative  . Not on file   Family History  Problem Relation Age of Onset  . Hypertension Father  OBJECTIVE:  Vitals:   12/18/17 0944 12/18/17 0946  BP:  104/70  Pulse:  78  Resp:  16  Temp:  98.4 F (36.9 C)  TempSrc:  Oral  SpO2:  98%  Weight: 148 lb (67.1 kg)      General appearance: alert; appears fatigued HEENT: Ears: EACs ceruminous, TMs appear pearly gray, without erythema; Eyes: PERRL, EOMI grossly; Sinuses nontender to palpation; Nose: clear rhinorrhea; Throat: oropharynx erythematous, tonsils 2+ and erythematous with white tonsillar exudates, uvula midline Neck: anterior cervical and posterior LAD Lungs: CTA bilaterally without adventitious breath sounds Heart: regular rate and rhythm.  Radial pulses 2+ symmetrical bilaterally Skin: warm and dry Psychological: alert and cooperative; normal mood and affect  Labs: Results for orders placed or performed during the hospital encounter of 12/18/17 (from the past 24  hour(s))  Infectious mono screen, POC     Status: None   Collection Time: 12/18/17 10:36 AM  Result Value Ref Range   Mono Screen NEGATIVE NEGATIVE  POCT rapid strep A [facility]     Status: None   Collection Time: 12/18/17 10:36 AM  Result Value Ref Range   Streptococcus, Group A Screen (Direct) NEGATIVE NEGATIVE     ASSESSMENT & PLAN:  1. Pharyngitis due to other organism   2. Sore throat     No orders of the defined types were placed in this encounter.  Strep test negative, will send out for culture and we will call you with results.   Mono was negative Oral cytology obtained.  We will follow up with you regarding the results Amoxicillin 500mg  twice daily for 10 days prescribed.  Take as directed and to completion Get plenty of rest and push fluids  Continue salt water gargles, and OTC medications as needed for symptomatic relief.   Follow up with PCP if symptoms persists Return or go to ER if patient has any new or worsening symptoms   Reviewed expectations re: course of current medical issues. Questions answered. Outlined signs and symptoms indicating need for more acute intervention. Patient verbalized understanding. After Visit Summary given.        Rennis HardingWurst, Maisey Deandrade, PA-C 12/18/17 1100

## 2017-12-18 NOTE — ED Triage Notes (Signed)
White spots in the back of her throat and has a lump on the right side of her neck.

## 2017-12-18 NOTE — Discharge Instructions (Addendum)
Strep test negative, will send out for culture and we will call you with results.   Mono was negative Oral cytology obtained.  We will follow up with you regarding the results Amoxicillin 500mg  twice daily for 10 days prescribed.  Take as directed and to completion Get plenty of rest and push fluids  Continue salt water gargles, and OTC medications as needed for symptomatic relief.   PCP assistance initiated  [facility]CP [facility] if symptoms persists Return or go to ER if patient has any new or worsening symptoms

## 2017-12-19 LAB — CULTURE, GROUP A STREP (THRC)

## 2017-12-21 LAB — CYTOLOGY, (ORAL, ANAL, URETHRAL) ANCILLARY ONLY
Chlamydia: NEGATIVE
NEISSERIA GONORRHEA: NEGATIVE

## 2017-12-22 ENCOUNTER — Telehealth (HOSPITAL_COMMUNITY): Payer: Self-pay

## 2017-12-22 NOTE — Telephone Encounter (Signed)
Culture is positive for non group A Strep germ.  This is a finding of uncertain significance; not the typical 'strep throat' germ.  Attempted to reach patient to follow up. No answer at this time. Voicemail left.

## 2018-01-19 ENCOUNTER — Ambulatory Visit (HOSPITAL_COMMUNITY)
Admission: EM | Admit: 2018-01-19 | Discharge: 2018-01-19 | Disposition: A | Discharge status: Home / Self Care | Payer: Medicaid Other | Attending: Family Medicine | Admitting: Family Medicine

## 2018-01-19 ENCOUNTER — Encounter (HOSPITAL_COMMUNITY): Payer: Self-pay | Admitting: Emergency Medicine

## 2018-01-19 DIAGNOSIS — J0100 Acute maxillary sinusitis, unspecified: Secondary | ICD-10-CM

## 2018-01-19 DIAGNOSIS — J340 Abscess, furuncle and carbuncle of nose: Secondary | ICD-10-CM

## 2018-01-19 DIAGNOSIS — L03211 Cellulitis of face: Secondary | ICD-10-CM

## 2018-01-19 MED ORDER — PROMETHAZINE-PHENYLEPHRINE 6.25-5 MG/5ML PO SYRP: 10.0000 mL | ORAL_SOLUTION | ORAL | 0 refills | Status: DC | PRN

## 2018-01-19 MED ORDER — SULFAMETHOXAZOLE-TRIMETHOPRIM 800-160 MG PO TABS: 1.0000 | ORAL_TABLET | Freq: Two times a day (BID) | ORAL | 0 refills | Status: AC

## 2018-01-19 NOTE — ED Provider Notes (Signed)
[facility]    CSN: [id number] Arrival date & time: 01/19/18  0857     History   Chief Complaint Chief Complaint  Patient presents with  . Facial Pain    HPI Diana Oconnell is a 47 y.o. female.   HPI  Patient had pharyngitis in late July and early August.  She was found to have strep positive non-group A.  She was treated with 10 days of antibiotics.  She felt better after this treatment.  She states now she has pain in the tip of her nose, swelling of her nose, trouble breathing through her nose, pain and pressure on the left side of her face in the cheek and behind the eye.  Pain in the left ear.  Still mild sore throat.  Swollen glands.  Fatigue.  Uncertain if this is related to her prior infection or if this is a new infection.  No no exposure to infection.  No fever or chills.  Past Medical History:  Diagnosis Date  . Anemia   . Anxiety   . Depression   . Infection    UTI  . Pancreatic cyst     There are no active problems to display for this patient.   Past Surgical History:  Procedure Laterality Date  . DILATION AND CURETTAGE OF UTERUS    . THERAPEUTIC ABORTION     multiple  . TUBAL LIGATION      OB History    Gravida  12   Para  5   Term  5   Preterm      AB  6   Living  5     SAB  3   TAB  3   Ectopic      Multiple      Live Births  5            Home Medications    Prior to Admission medications   Medication Sig Start Date End Date Taking? Authorizing Provider  promethazine-phenylephrine (PROMETHAZINE VC) 6.25-5 MG/5ML SYRP Take 10 mLs by mouth every 4 (four) hours as needed for congestion. 01/19/18   Eustace Moore, MD  sulfamethoxazole-trimethoprim (BACTRIM DS,SEPTRA DS) 800-160 MG tablet Take 1 tablet by mouth 2 (two) times daily for 7 days. 01/19/18 01/26/18  Eustace Moore, MD    Family History Family History  Problem Relation Age of Onset  . Hypertension Father     Social History Social History     Tobacco Use  . Smoking status: Current Every Day Smoker    Packs/day: 0.25    Years: 12.00    Pack years: 3.00    Types: Cigarettes  . Smokeless tobacco: Never Used  Substance Use Topics  . Alcohol use: Yes    Comment: occasional  . Drug use: Yes    Types: Marijuana    Comment: Smokes marijuana "all day every day"     Allergies   Patient has no known allergies.   Review of Systems Review of Systems  Constitutional: Negative for chills and fever.  HENT: Positive for congestion, ear pain, postnasal drip, sinus pressure, sinus pain and sore throat.   Eyes: Negative for pain and visual disturbance.  Respiratory: Negative for cough and shortness of breath.   Cardiovascular: Negative for chest pain and palpitations.  Gastrointestinal: Negative for abdominal pain, nausea and vomiting.  Genitourinary: Negative for dysuria and hematuria.  Musculoskeletal: Negative for arthralgias and back pain.  Skin: Negative for color change and rash.  Neurological:  Negative for seizures, syncope and headaches.  Hematological: Positive for adenopathy. Does not bruise/bleed easily.  All other systems reviewed and are negative.    Physical Exam Triage Vital Signs ED Triage Vitals [01/19/18 0930]  Enc Vitals Group     BP 114/77     Pulse Rate (!) 59     Resp 16     Temp 97.9 F (36.6 C)     Temp Source Oral     SpO2 100 %     Weight 145 lb (65.8 kg)     Height      Head Circumference      Peak Flow      Pain Score 9     Pain Loc      Pain Edu?      Excl. in GC?    No data found.  Updated Vital Signs BP 114/77   Pulse (!) 59   Temp 97.9 F (36.6 C) (Oral)   Resp 16   Wt 65.8 kg   LMP 01/13/2018   SpO2 100%   BMI 22.71 kg/m   Visual Acuity Right Eye Distance:   Left Eye Distance:   Bilateral Distance:    Right Eye Near:   Left Eye Near:    Bilateral Near:     Physical Exam  Constitutional: She appears well-developed and well-nourished. No distress.  HENT:   Head: Normocephalic and atraumatic.  Right Ear: External ear normal.  Left Ear: External ear normal.  There is swelling and erythema to the tip of the nose and to the left nostril consistent with a cellulitis.  Nasal membranes are swollen and red.  Posterior pharynx shows mild tonsillar enlargement left greater than right.  Erythema.  No exudate.  Eyes: Pupils are equal, round, and reactive to light. Conjunctivae are normal.  Neck: Normal range of motion. No thyromegaly present.  Cardiovascular: Normal rate, regular rhythm and normal heart sounds.  Pulmonary/Chest: Effort normal and breath sounds normal. No stridor. No respiratory distress.  Abdominal: Soft. Bowel sounds are normal. She exhibits no distension. There is no tenderness.  Musculoskeletal: Normal range of motion. She exhibits no edema.  Lymphadenopathy:    She has cervical adenopathy.  Neurological: She is alert.  Skin: Skin is warm and dry.     UC Treatments / Results  Labs (all labs ordered are listed, but only abnormal results are displayed) Labs Reviewed - No data to display  EKG None  Radiology No results found.  Procedures Procedures (including critical care time)  Medications Ordered in UC Medications - No data to display  Initial Impression / Assessment and Plan / UC Course  I have reviewed the triage vital signs and the nursing notes.  Pertinent labs & imaging results that were available during my care of the patient were reviewed by me and considered in my medical decision making (see chart for details).     Final Clinical Impressions(s) / UC Diagnoses   Final diagnoses:  Acute non-recurrent maxillary sinusitis  Cellulitis of nasal tip     Discharge Instructions     Take the antibiotic twice a day.  Take 2 doses today. Take the decongestant syrup every 4 hours.  This is to help promote drainage from your sinuses. Drink plenty of fluids Rest Return or see your PCP if not improving in 2  days   ED Prescriptions    Medication Sig Dispense Auth. Provider   sulfamethoxazole-trimethoprim (BACTRIM DS,SEPTRA DS) 800-160 MG tablet Take 1 tablet by mouth  2 (two) times daily for 7 days. 14 tablet Eustace Moore, MD   promethazine-phenylephrine (PROMETHAZINE VC) 6.25-5 MG/5ML SYRP Take 10 mLs by mouth every 4 (four) hours as needed for congestion. 280 mL Eustace Moore, MD     Controlled Substance Prescriptions [place] Controlled Substance Registry consulted? Not Applicable   Eustace Moore, MD 01/19/18 1109

## 2018-01-19 NOTE — ED Triage Notes (Signed)
[facility] and had a nasal and throat swab done. PT was treated for strep. PT reports left nostril pain since swab. PT reports pain extends into face/ sinuses.

## 2018-01-19 NOTE — Discharge Instructions (Addendum)
Take the antibiotic twice a day.  Take 2 doses today. Take the decongestant syrup every 4 hours.  This is to help promote drainage from your sinuses. Drink plenty of fluids Rest Return or see your PCP if not improving in 2 days

## 2018-03-06 ENCOUNTER — Other Ambulatory Visit: Payer: Self-pay

## 2018-03-06 ENCOUNTER — Encounter (HOSPITAL_COMMUNITY): Payer: Self-pay | Admitting: Emergency Medicine

## 2018-03-06 ENCOUNTER — Ambulatory Visit (HOSPITAL_COMMUNITY)
Admission: EM | Admit: 2018-03-06 | Discharge: 2018-03-06 | Disposition: A | Discharge status: Home / Self Care | Payer: Medicaid Other | Attending: Internal Medicine | Admitting: Internal Medicine

## 2018-03-06 DIAGNOSIS — Z113 Encounter for screening for infections with a predominantly sexual mode of transmission: Secondary | ICD-10-CM

## 2018-03-06 DIAGNOSIS — Z202 Contact with and (suspected) exposure to infections with a predominantly sexual mode of transmission: Secondary | ICD-10-CM

## 2018-03-06 DIAGNOSIS — Z3202 Encounter for pregnancy test, result negative: Secondary | ICD-10-CM

## 2018-03-06 DIAGNOSIS — F17210 Nicotine dependence, cigarettes, uncomplicated: Secondary | ICD-10-CM | POA: Insufficient documentation

## 2018-03-06 DIAGNOSIS — Z8249 Family history of ischemic heart disease and other diseases of the circulatory system: Secondary | ICD-10-CM | POA: Insufficient documentation

## 2018-03-06 DIAGNOSIS — R1030 Lower abdominal pain, unspecified: Secondary | ICD-10-CM | POA: Insufficient documentation

## 2018-03-06 DIAGNOSIS — R110 Nausea: Secondary | ICD-10-CM | POA: Insufficient documentation

## 2018-03-06 DIAGNOSIS — Z9851 Tubal ligation status: Secondary | ICD-10-CM | POA: Insufficient documentation

## 2018-03-06 LAB — POCT URINALYSIS DIP (DEVICE)
Bilirubin Urine: NEGATIVE
Glucose, UA: NEGATIVE mg/dL
Ketones, ur: NEGATIVE mg/dL
Leukocytes, UA: NEGATIVE
NITRITE: NEGATIVE
PH: 6 (ref 5.0–8.0)
PROTEIN: NEGATIVE mg/dL
Specific Gravity, Urine: 1.02 (ref 1.005–1.030)
UROBILINOGEN UA: 1 mg/dL (ref 0.0–1.0)

## 2018-03-06 LAB — POCT PREGNANCY, URINE: Preg Test, Ur: NEGATIVE

## 2018-03-06 MED ORDER — METRONIDAZOLE 500 MG PO TABS: 500.0000 mg | ORAL_TABLET | Freq: Two times a day (BID) | ORAL | 0 refills | Status: DC

## 2018-03-06 NOTE — ED Triage Notes (Signed)
The patient presented to the [facility] with a complaint of right side abdominal pain. The patient also reported that her sexual partner was recently diagnosed with trichomonas.

## 2018-03-06 NOTE — Discharge Instructions (Signed)
Please begin taking metronidazole twice daily for 7 days. This will treat both BV and Trich. Do not drink alcohol until 24 hours after taking last tablet.  We are testing you for Gonorrhea, Chlamydia, Trichomonas, Yeast and Bacterial Vaginosis. We will call you if anything is positive and let you know if you require any further treatment. Please inform partners of any positive results.   Please return if symptoms not improving with treatment, development of fever, nausea, vomiting, abdominal pain.

## 2018-03-06 NOTE — ED Provider Notes (Signed)
[facility]    CSN: [id number] Arrival date & time: 03/06/18  1538     History   Chief Complaint Chief Complaint  Patient presents with  . Abdominal Pain    HPI Diana Oconnell is a 47 y.o. female history of tubal ligation presenting today for evaluation of abdominal pain.  Patient states that for the past week she has had some lower abdominal discomfort.  She has been taking Motrin for this.  States that it feels as if she was about to start her cycle.  She recently found out her partner tested positive for trichomonas.  She denies any abnormal discharge or pelvic pain, but has noticed an abnormal odor.  Denies urinary symptoms of dysuria or increased frequency.  Denies fever, vomiting, changes in bowel movements, diarrhea.  Patient has had some mild nausea.  Eating and drinking relatively normally, eating does not alter pain.  Denies back pain.  HPI  Past Medical History:  Diagnosis Date  . Anemia   . Anxiety   . Depression   . Infection    UTI  . Pancreatic cyst     There are no active problems to display for this patient.   Past Surgical History:  Procedure Laterality Date  . DILATION AND CURETTAGE OF UTERUS    . THERAPEUTIC ABORTION     multiple  . TUBAL LIGATION      OB History    Gravida  12   Para  5   Term  5   Preterm      AB  6   Living  5     SAB  3   TAB  3   Ectopic      Multiple      Live Births  5            Home Medications    Prior to Admission medications   Medication Sig Start Date End Date Taking? Authorizing Provider  metroNIDAZOLE (FLAGYL) 500 MG tablet Take 1 tablet (500 mg total) by mouth 2 (two) times daily. 03/06/18   Wieters, Junius Creamer, PA-C    Family History Family History  Problem Relation Age of Onset  . Hypertension Father     Social History Social History   Tobacco Use  . Smoking status: Current Every Day Smoker    Packs/day: 0.25    Years: 12.00    Pack years: 3.00    Types:  Cigarettes  . Smokeless tobacco: Never Used  Substance Use Topics  . Alcohol use: Yes    Comment: occasional  . Drug use: Yes    Types: Marijuana    Comment: Smokes marijuana "all day every day"     Allergies   Patient has no known allergies.   Review of Systems Review of Systems  Constitutional: Negative for activity change, appetite change, fatigue and fever.  Respiratory: Negative for shortness of breath.   Cardiovascular: Negative for chest pain.  Gastrointestinal: Positive for abdominal pain and nausea. Negative for diarrhea and vomiting.  Genitourinary: Negative for dysuria, flank pain, genital sores, hematuria, menstrual problem, vaginal bleeding, vaginal discharge and vaginal pain.  Musculoskeletal: Negative for back pain.  Skin: Negative for rash.  Neurological: Negative for dizziness, light-headedness and headaches.     Physical Exam Triage Vital Signs ED Triage Vitals  Enc Vitals Group     BP 03/06/18 1641 106/69     Pulse Rate 03/06/18 1641 70     Resp 03/06/18 1641 16  Temp 03/06/18 1641 98.2 F (36.8 C)     Temp Source 03/06/18 1641 Oral     SpO2 03/06/18 1641 100 %     Weight --      Height --      Head Circumference --      Peak Flow --      Pain Score 03/06/18 1640 7     Pain Loc --      Pain Edu? --      Excl. in GC? --    No data found.  Updated Vital Signs BP 106/69 (BP Location: Left Arm)   Pulse 70   Temp 98.2 F (36.8 C) (Oral)   Resp 16   LMP 02/20/2018   SpO2 100%   Visual Acuity Right Eye Distance:   Left Eye Distance:   Bilateral Distance:    Right Eye Near:   Left Eye Near:    Bilateral Near:     Physical Exam  Constitutional: She appears well-developed and well-nourished. No distress.  HENT:  Head: Normocephalic and atraumatic.  Eyes: Conjunctivae are normal.  Neck: Neck supple.  Cardiovascular: Normal rate and regular rhythm.  No murmur heard. Pulmonary/Chest: Effort normal and breath sounds normal. No  respiratory distress.  Abdominal: Soft. There is tenderness.  Tenderness to palpation throughout bilateral lower quadrants of abdomen, no focal tenderness, negative rebound, negative Rovsing, negative McBurney's. No CVA tenderness  Genitourinary:  Genitourinary Comments: Normal external female genitalia, small amount of weight discharge present, cervix without erythema, no CMT  Musculoskeletal: She exhibits no edema.  Neurological: She is alert.  Skin: Skin is warm and dry.  Psychiatric: She has a normal mood and affect.  Nursing note and vitals reviewed.    UC Treatments / Results  Labs (all labs ordered are listed, but only abnormal results are displayed) Labs Reviewed  POCT URINALYSIS DIP (DEVICE) - Abnormal; Notable for the following components:      Result Value   Hgb urine dipstick TRACE (*)    All other components within normal limits  POCT PREGNANCY, URINE  CERVICOVAGINAL ANCILLARY ONLY    EKG None  Radiology No results found.  Procedures Procedures (including critical care time)  Medications Ordered in UC Medications - No data to display  Initial Impression / Assessment and Plan / UC Course  I have reviewed the triage vital signs and the nursing notes.  Pertinent labs & imaging results that were available during my care of the patient were reviewed by me and considered in my medical decision making (see chart for details).     Patient with nonspecific lower abdominal pain, positive exposure to trichomonas, swab obtained to check for this.  We will go ahead and empirically treat with metronidazole.  Will have patient continue to monitor abdominal pain and follow-up if not improving or worsening with treatment.Discussed strict return precautions. Patient verbalized understanding and is agreeable with plan.  Final Clinical Impressions(s) / UC Diagnoses   Final diagnoses:  Lower abdominal pain     Discharge Instructions     Please begin taking  metronidazole twice daily for 7 days. This will treat both BV and Trich. Do not drink alcohol until 24 hours after taking last tablet.  We are testing you for Gonorrhea, Chlamydia, Trichomonas, Yeast and Bacterial Vaginosis. We will call you if anything is positive and let you know if you require any further treatment. Please inform partners of any positive results.   Please return if symptoms not improving with treatment, development  of fever, nausea, vomiting, abdominal pain.    ED Prescriptions    Medication Sig Dispense Auth. Provider   metroNIDAZOLE (FLAGYL) 500 MG tablet Take 1 tablet (500 mg total) by mouth 2 (two) times daily. 14 tablet Wieters, [place] C, PA-C     Controlled Substance Prescriptions [place] Controlled Substance Registry consulted? Not Applicable   Lew Dawes, [place] 03/06/18 1737

## 2018-03-08 LAB — CERVICOVAGINAL ANCILLARY ONLY
BACTERIAL VAGINITIS: POSITIVE — AB
Candida vaginitis: NEGATIVE
Chlamydia: NEGATIVE
Neisseria Gonorrhea: NEGATIVE
Trichomonas: NEGATIVE

## 2018-03-09 ENCOUNTER — Telehealth: Payer: Self-pay | Admitting: Emergency Medicine

## 2018-03-09 MED ORDER — FLUCONAZOLE 150 MG PO TABS: ORAL_TABLET | ORAL | 0 refills | Status: DC

## 2018-03-09 NOTE — Telephone Encounter (Signed)
Test for candida (yeast) was positive.  Prescription for fluconazole 150mg po now, repeat dose in 3d if needed, #2 no refills, sent to the pharmacy of record.  Recheck or followup with PCP for further evaluation if symptoms are not improving.  Patient aware 

## 2018-03-10 ENCOUNTER — Telehealth: Payer: Self-pay | Admitting: Emergency Medicine

## 2018-03-10 NOTE — Telephone Encounter (Addendum)
RX for Fluconazole was cancelled with the pharmacy.  Pharmacist states patient has not picked up prescription for flagyl yet.  Patient was called and message was left to pick up rx for flagyl and take as directed and to call back with any questions or concerns.

## 2018-05-14 ENCOUNTER — Emergency Department (HOSPITAL_COMMUNITY)
Admission: EM | Admit: 2018-05-14 | Discharge: 2018-05-15 | Discharge status: Against Medical Advice | Payer: Medicaid Other | Attending: Emergency Medicine | Admitting: Emergency Medicine

## 2018-05-14 ENCOUNTER — Emergency Department (HOSPITAL_COMMUNITY): Payer: Medicaid Other

## 2018-05-14 ENCOUNTER — Encounter (HOSPITAL_COMMUNITY): Payer: Self-pay | Admitting: Emergency Medicine

## 2018-05-14 DIAGNOSIS — Z5321 Procedure and treatment not carried out due to patient leaving prior to being seen by health care provider: Secondary | ICD-10-CM | POA: Insufficient documentation

## 2018-05-14 LAB — CBC
HCT: 40.4 % (ref 36.0–46.0)
HEMOGLOBIN: 13.1 g/dL (ref 12.0–15.0)
MCH: 31 pg (ref 26.0–34.0)
MCHC: 32.4 g/dL (ref 30.0–36.0)
MCV: 95.7 fL (ref 80.0–100.0)
Platelets: 235 10*3/uL (ref 150–400)
RBC: 4.22 MIL/uL (ref 3.87–5.11)
RDW: 12.9 % (ref 11.5–15.5)
WBC: 4.9 10*3/uL (ref 4.0–10.5)
nRBC: 0 % (ref 0.0–0.2)

## 2018-05-14 LAB — BASIC METABOLIC PANEL
ANION GAP: 8 (ref 5–15)
BUN: <5 mg/dL — ABNORMAL LOW (ref 6–20)
CO2: 27 mmol/L (ref 22–32)
Calcium: 9.3 mg/dL (ref 8.9–10.3)
Chloride: 106 mmol/L (ref 98–111)
Creatinine, Ser: 0.82 mg/dL (ref 0.44–1.00)
GFR calc Af Amer: >60 mL/min (ref >60)
GFR calc non Af Amer: >60 mL/min (ref >60)
Glucose, Bld: 90 mg/dL (ref 70–99)
POTASSIUM: 3.5 mmol/L (ref 3.5–5.1)
SODIUM: 141 mmol/L (ref 135–145)

## 2018-05-14 LAB — I-STAT TROPONIN, ED: Troponin i, poc: 0 ng/mL (ref 0.00–0.08)

## 2018-05-14 NOTE — ED Triage Notes (Signed)
Pt reports left sided chest pain that radiates to her back for the past week.  Pt reports she has been trying to "stay meditated and smoke weed b/c I just don't have time to stop."  Reports periods of dizziness and nausea.

## 2018-05-15 NOTE — ED Notes (Signed)
Called to recheck pts vitals no answer

## 2018-05-17 LAB — I-STAT BETA HCG BLOOD, ED (MC, WL, AP ONLY): I-stat hCG, quantitative: <5 m[IU]/mL (ref <5)

## 2018-09-19 ENCOUNTER — Other Ambulatory Visit: Payer: Self-pay

## 2018-09-19 ENCOUNTER — Encounter (HOSPITAL_COMMUNITY): Payer: Self-pay

## 2018-09-19 ENCOUNTER — Ambulatory Visit (HOSPITAL_COMMUNITY)
Admission: EM | Admit: 2018-09-19 | Discharge: 2018-09-19 | Disposition: A | Discharge status: Home / Self Care | Payer: Medicaid Other | Attending: Emergency Medicine | Admitting: Emergency Medicine

## 2018-09-19 DIAGNOSIS — R21 Rash and other nonspecific skin eruption: Secondary | ICD-10-CM

## 2018-09-19 MED ORDER — TRIAMCINOLONE ACETONIDE 0.1 % EX CREA: 1.0000 "application " | TOPICAL_CREAM | Freq: Two times a day (BID) | CUTANEOUS | 0 refills | Status: DC

## 2018-09-19 NOTE — Discharge Instructions (Signed)
May use cream twice daily in areas causing itching Monitor for changes or spreading of rash, follow up if not getting better or changing/worsening

## 2018-09-19 NOTE — ED Triage Notes (Signed)
Pt presents with what she believes may be insect bites on different areas of her body from staying at her partners house.

## 2018-09-19 NOTE — ED Notes (Signed)
Patient went to car for something, not in lobby

## 2018-09-19 NOTE — ED Provider Notes (Signed)
[facility]    CSN: [id number] Arrival date & time: 09/19/18  1306     History   Chief Complaint Chief Complaint  Patient presents with  . Rash    HPI Diana Oconnell is a 48 y.o. female no contributing past medical history presenting today for evaluation of skin irritation.  Patient states that over the past week she has developed itching sensation and bumps around her neck as well as her abdomen.  She notes that she has been sleeping at her boyfriend's house and feels at nighttime as if there bugs biting her around her neck.  She notes that she never had this issue prior to sleeping at his house.  She has attempted to wash linens in hot water.  She uses Dove lotion frequently for moisturizing.  Denies any new soaps, lotions or detergents.  Denies boyfriend having similar symptoms.  She does note she recently had a new weave placed.  Denies significant symptoms on her hands, but will occasionally have a sensation of itching.  She has not used anything for her symptoms.  HPI  Past Medical History:  Diagnosis Date  . Anemia   . Anxiety   . Depression   . Infection    UTI  . Pancreatic cyst     There are no active problems to display for this patient.   Past Surgical History:  Procedure Laterality Date  . DILATION AND CURETTAGE OF UTERUS    . THERAPEUTIC ABORTION     multiple  . TUBAL LIGATION      OB History    Gravida  12   Para  5   Term  5   Preterm      AB  6   Living  5     SAB  3   TAB  3   Ectopic      Multiple      Live Births  5            Home Medications    Prior to Admission medications   Medication Sig Start Date End Date Taking? Authorizing Provider  triamcinolone cream (KENALOG) 0.1 % Apply 1 application topically 2 (two) times daily. 09/19/18   Mehreen Azizi, Junius Creamer, PA-C    Family History Family History  Problem Relation Age of Onset  . Hypertension Father     Social History Social History   Tobacco Use   . Smoking status: Current Every Day Smoker    Packs/day: 0.25    Years: 12.00    Pack years: 3.00    Types: Cigarettes  . Smokeless tobacco: Never Used  Substance Use Topics  . Alcohol use: Yes    Comment: occasional  . Drug use: Yes    Types: Marijuana    Comment: Smokes marijuana "all day every day"     Allergies   Patient has no known allergies.   Review of Systems Review of Systems  Constitutional: Negative for fatigue and fever.  Eyes: Negative for visual disturbance.  Respiratory: Negative for shortness of breath.   Cardiovascular: Negative for chest pain.  Gastrointestinal: Negative for abdominal pain, nausea and vomiting.  Musculoskeletal: Negative for arthralgias and joint swelling.  Skin: Positive for rash. Negative for color change and wound.  Neurological: Negative for dizziness, weakness, light-headedness and headaches.     Physical Exam Triage Vital Signs ED Triage Vitals  Enc Vitals Group     BP 09/19/18 1331 98/69     Pulse Rate 09/19/18 1331 78  Resp 09/19/18 1331 18     Temp 09/19/18 1331 98.5 F (36.9 C)     Temp Source 09/19/18 1331 Oral     SpO2 09/19/18 1331 97 %     Weight --      Height --      Head Circumference --      Peak Flow --      Pain Score 09/19/18 1334 0     Pain Loc --      Pain Edu? --      Excl. in GC? --    No data found.  Updated Vital Signs BP 98/69 (BP Location: Right Arm)   Pulse 78   Temp 98.5 F (36.9 C) (Oral)   Resp 18   LMP 08/25/2018   SpO2 97%   Visual Acuity Right Eye Distance:   Left Eye Distance:   Bilateral Distance:    Right Eye Near:   Left Eye Near:    Bilateral Near:     Physical Exam Vitals signs and nursing note reviewed.  Constitutional:      General: She is not in acute distress.    Appearance: She is well-developed.  HENT:     Head: Normocephalic and atraumatic.     Mouth/Throat:     Comments: Oral mucosa pink and moist, no tonsillar enlargement or exudate. Posterior  pharynx patent and nonerythematous, no uvula deviation or swelling. Normal phonation.  No lesions on oral mucosa Eyes:     Conjunctiva/sclera: Conjunctivae normal.  Neck:     Musculoskeletal: Neck supple.  Cardiovascular:     Rate and Rhythm: Normal rate and regular rhythm.     Heart sounds: No murmur.  Pulmonary:     Effort: Pulmonary effort is normal. No respiratory distress.     Breath sounds: Normal breath sounds.  Abdominal:     Palpations: Abdomen is soft.     Tenderness: There is no abdominal tenderness.  Skin:    General: Skin is warm and dry.     Comments: No obvious rash noted to skin, slightly more prominent be raised papular bumps around nape of neck, no surrounding erythema, appears skin colored  Few similar bumps noted to upper abdomen, one small scab, likely from excoriation, no erythema  No bumps noted on extremities, and webbing's of fingers, no lesions noted on palms  Neurological:     Mental Status: She is alert.      UC Treatments / Results  Labs (all labs ordered are listed, but only abnormal results are displayed) Labs Reviewed - No data to display  EKG None  Radiology No results found.  Procedures Procedures (including critical care time)  Medications Ordered in UC Medications - No data to display  Initial Impression / Assessment and Plan / UC Course  I have reviewed the triage vital signs and the nursing notes.  Pertinent labs & imaging results that were available during my care of the patient were reviewed by me and considered in my medical decision making (see chart for details).     Patient with no obvious rash, slightly more prominent bumps noted but are not suggestive of bug bites or bedbugs or scabies.  Not suggestive of infectious cause.  Possible irritation from new weave versus other new irritant unable to identify during visit.  At this time will provide triamcinolone cream to use as needed for symptomatic relief of bumps.   Continue to monitor,Discussed strict return precautions. Patient verbalized understanding and is agreeable with plan.  Final Clinical Impressions(s) / UC Diagnoses   Final diagnoses:  Rash and nonspecific skin eruption     Discharge Instructions     May use cream twice daily in areas causing itching Monitor for changes or spreading of rash, follow up if not getting better or changing/worsening   ED Prescriptions    Medication Sig Dispense Auth. Provider   triamcinolone cream (KENALOG) 0.1 % Apply 1 application topically 2 (two) times daily. 30 g Rui Wordell, [place] C, PA-C     Controlled Substance Prescriptions [place] Controlled Substance Registry consulted? Not Applicable   Lew Dawes, [place] 09/19/18 1417

## 2018-09-22 ENCOUNTER — Encounter (HOSPITAL_COMMUNITY): Payer: Self-pay

## 2018-09-22 ENCOUNTER — Ambulatory Visit (HOSPITAL_COMMUNITY)
Admission: EM | Admit: 2018-09-22 | Discharge: 2018-09-22 | Disposition: A | Discharge status: Home / Self Care | Payer: Self-pay | Attending: Family Medicine | Admitting: Family Medicine

## 2018-09-22 ENCOUNTER — Other Ambulatory Visit: Payer: Self-pay

## 2018-09-22 DIAGNOSIS — N898 Other specified noninflammatory disorders of vagina: Secondary | ICD-10-CM | POA: Insufficient documentation

## 2018-09-22 DIAGNOSIS — R300 Dysuria: Secondary | ICD-10-CM | POA: Insufficient documentation

## 2018-09-22 LAB — POCT URINALYSIS DIP (DEVICE)
Bilirubin Urine: NEGATIVE
Glucose, UA: NEGATIVE mg/dL
Hgb urine dipstick: NEGATIVE
Ketones, ur: NEGATIVE mg/dL
Nitrite: NEGATIVE
Protein, ur: NEGATIVE mg/dL
Specific Gravity, Urine: >=1.03 (ref 1.005–1.030)
Urobilinogen, UA: 4 mg/dL — ABNORMAL HIGH (ref 0.0–1.0)
pH: 6 (ref 5.0–8.0)

## 2018-09-22 MED ORDER — CEFTRIAXONE SODIUM 250 MG IJ SOLR: 250.0000 mg | Freq: Once | INTRAMUSCULAR | Status: DC

## 2018-09-22 MED ORDER — CEFTRIAXONE SODIUM 250 MG IJ SOLR
INTRAMUSCULAR | Status: AC
  Filled 2018-09-22: qty 250

## 2018-09-22 MED ORDER — AZITHROMYCIN 250 MG PO TABS
ORAL_TABLET | ORAL | Status: AC
  Filled 2018-09-22: qty 4

## 2018-09-22 MED ORDER — AZITHROMYCIN 250 MG PO TABS: 1000.0000 mg | ORAL_TABLET | Freq: Once | ORAL | Status: DC

## 2018-09-22 NOTE — Discharge Instructions (Addendum)

## 2018-09-22 NOTE — ED Triage Notes (Signed)
Burning with urination and odorous vaginal discharge that began 4 days ago

## 2018-09-22 NOTE — ED Provider Notes (Signed)
[facility]   [id number] 09/22/18 Arrival Time: 1154  ASSESSMENT & PLAN:  1. Vaginal discharge   2. Dysuria      Discharge Instructions     You have been given the following medications today for treatment of suspected gonorrhea and/or chlamydia:  cefTRIAXone (ROCEPHIN) injection 250 mg azithromycin (ZITHROMAX) tablet 1,000 mg  Even though we have treated you today, we have sent testing for sexually transmitted infections. We will notify you of any positive results once they are received. If required, we will prescribe any medications you might need.  Please refrain from all sexual activity for at least the next seven days.   No urine culture sent secondary to financial reasons; pt is self-pay.  Pending: Labs Reviewed  POCT URINALYSIS DIP (DEVICE) - Abnormal; Notable for the following components:      Result Value   Urobilinogen, UA 4.0 (*)    Leukocytes,Ua TRACE (*)    All other components within normal limits  CERVICOVAGINAL ANCILLARY ONLY   Will notify of any positive results. Instructed to refrain from sexual activity for at least seven days.  Reviewed expectations re: course of current medical issues. Questions answered. Outlined signs and symptoms indicating need for more acute intervention. Patient verbalized understanding. After Visit Summary given.   SUBJECTIVE:  Diana Oconnell is a 48 y.o. female who presents with complaint of vaginal discharge and mild dysuria. Onset gradual. First noticed 4 d ago. Describes discharge as thick and white/yellow. Questions mild odor. No vaginal bleeding. Afebrile. No abdominal or pelvic pain. No n/v. No rashes or lesions. Sexually active with single female partner without regular condom use. OTC treatment: none reported. History of STI: none reported.  Patient's last menstrual period was 08/25/2018.  ROS: As per HPI.  OBJECTIVE:  Vitals:   09/22/18 1208  BP: 99/67  Pulse: 84  Resp: 18  Temp: 98.6 F  (37 C)  SpO2: 99%    General appearance: alert, cooperative, appears stated age and no distress Throat: lips, mucosa, and tongue normal; teeth and gums normal CV: RRR Lungs: CTAB Back: no CVA tenderness; FROM at waist Abdomen: soft, non-tender; no guarding or rebound tenderness GU: deferred Skin: warm and dry Psychological: alert and cooperative; normal mood and affect.  Results for orders placed or performed during the hospital encounter of 09/22/18  POCT urinalysis dip (device)  Result Value Ref Range   Glucose, UA NEGATIVE NEGATIVE mg/dL   Bilirubin Urine NEGATIVE NEGATIVE   Ketones, ur NEGATIVE NEGATIVE mg/dL   Specific Gravity, Urine >=1.030 1.005 - 1.030   Hgb urine dipstick NEGATIVE NEGATIVE   pH 6.0 5.0 - 8.0   Protein, ur NEGATIVE NEGATIVE mg/dL   Urobilinogen, UA 4.0 (H) 0.0 - 1.0 mg/dL   Nitrite NEGATIVE NEGATIVE   Leukocytes,Ua TRACE (A) NEGATIVE    Labs Reviewed  POCT URINALYSIS DIP (DEVICE) - Abnormal; Notable for the following components:      Result Value   Urobilinogen, UA 4.0 (*)    Leukocytes,Ua TRACE (*)    All other components within normal limits  CERVICOVAGINAL ANCILLARY ONLY    No Known Allergies  Past Medical History:  Diagnosis Date  . Anemia   . Anxiety   . Depression   . Infection    UTI  . Pancreatic cyst    Family History  Problem Relation Age of Onset  . Hypertension Father    Social History   Socioeconomic History  . Marital status: Divorced    Spouse name: Not on  file  . Number of children: Not on file  . Years of education: Not on file  . Highest education level: Not on file  Occupational History  . Not on file  Social Needs  . Financial resource strain: Not on file  . Food insecurity:    Worry: Not on file    Inability: Not on file  . Transportation needs:    Medical: Not on file    Non-medical: Not on file  Tobacco Use  . Smoking status: Current Every Day Smoker    Packs/day: 0.25    Years: 12.00    Pack  years: 3.00    Types: Cigarettes  . Smokeless tobacco: Never Used  Substance and Sexual Activity  . Alcohol use: Yes    Comment: occasional  . Drug use: Yes    Types: Marijuana    Comment: Smokes marijuana "all day every day"  . Sexual activity: Yes    Birth control/protection: Surgical  Lifestyle  . Physical activity:    Days per week: Not on file    Minutes per session: Not on file  . Stress: Not on file  Relationships  . Social connections:    Talks on phone: Not on file    Gets together: Not on file    Attends religious service: Not on file    Active member of club or organization: Not on file    Attends meetings of clubs or organizations: Not on file    Relationship status: Not on file  . Intimate partner violence:    Fear of current or ex partner: Not on file    Emotionally abused: Not on file    Physically abused: Not on file    Forced sexual activity: Not on file  Other Topics Concern  . Not on file  Social History Narrative  . Not on file          Mardella LaymanHagler, Breylon Sherrow, MD 09/22/18 1401

## 2018-09-23 ENCOUNTER — Telehealth (HOSPITAL_COMMUNITY): Payer: Self-pay | Admitting: Emergency Medicine

## 2018-09-23 LAB — CERVICOVAGINAL ANCILLARY ONLY
Bacterial vaginitis: POSITIVE — AB
Candida vaginitis: NEGATIVE
Chlamydia: NEGATIVE
Neisseria Gonorrhea: NEGATIVE
Trichomonas: POSITIVE — AB

## 2018-09-23 MED ORDER — METRONIDAZOLE 500 MG PO TABS: 500.0000 mg | ORAL_TABLET | Freq: Two times a day (BID) | ORAL | 0 refills | Status: AC

## 2018-09-23 NOTE — Telephone Encounter (Signed)
Bacterial vaginosis is positive. This was not treated at the urgent care visit.  Flagyl 500 mg BID x 7 days #14 no refills sent to patients pharmacy of choice.    Trichomonas is positive. Rx  for Flagy, once was sent to the pharmacy of record. Pt needs education to refrain from sexual intercourse for 7 days to give the medicine time to work. Sexual partners need to be notified and tested/treated. Condoms may reduce risk of reinfection. Recheck for further evaluation if symptoms are not improving.   Patient contacted and made aware of all results, all questions answered.

## 2018-10-01 ENCOUNTER — Encounter (HOSPITAL_COMMUNITY): Payer: Self-pay

## 2018-10-01 ENCOUNTER — Other Ambulatory Visit: Payer: Self-pay

## 2018-10-01 ENCOUNTER — Ambulatory Visit (HOSPITAL_COMMUNITY)
Admission: EM | Admit: 2018-10-01 | Discharge: 2018-10-01 | Disposition: A | Discharge status: Home / Self Care | Payer: Self-pay | Attending: Family Medicine | Admitting: Family Medicine

## 2018-10-01 DIAGNOSIS — T7840XA Allergy, unspecified, initial encounter: Secondary | ICD-10-CM

## 2018-10-01 DIAGNOSIS — R0982 Postnasal drip: Secondary | ICD-10-CM

## 2018-10-01 DIAGNOSIS — Z9109 Other allergy status, other than to drugs and biological substances: Secondary | ICD-10-CM

## 2018-10-01 MED ORDER — LORATADINE-PSEUDOEPHEDRINE ER 5-120 MG PO TB12: 1.0000 | ORAL_TABLET | Freq: Two times a day (BID) | ORAL | 0 refills | Status: DC

## 2018-10-01 MED ORDER — FLUTICASONE PROPIONATE 50 MCG/ACT NA SUSP: 2.0000 | Freq: Every day | NASAL | 0 refills | Status: DC

## 2018-10-01 NOTE — ED Triage Notes (Signed)
Pt c/o allergies x1wk, sneezing and running nose. States has been taking benadryl but unable to work when taking d/t drowsiness

## 2018-10-01 NOTE — ED Provider Notes (Signed)
[facility]    CSN: [id number] Arrival date & time: 10/01/18  1506     History   Chief Complaint Chief Complaint  Patient presents with  . Allergies    HPI Diana Oconnell is a 48 y.o. female.   HPI  Patient is here for allergy symptoms.  She states that she has been having excessive watery runny nose.  Some itchy eyes.  Some postnasal drip that causes her to clear her throat.  She is forced to wear a mask at work.  She states that she is not comfortable in the mask with the constant runny nose.  She tried taking a Benadryl before went to work, but now she feels too sleepy to work.  She wants to know what else she can take that will control her symptoms, she does not want to appear sick at work, she wants to know how to manage the runny nose and a mask.  Past Medical History:  Diagnosis Date  . Anemia   . Anxiety   . Depression   . Infection    UTI  . Pancreatic cyst     There are no active problems to display for this patient.   Past Surgical History:  Procedure Laterality Date  . DILATION AND CURETTAGE OF UTERUS    . THERAPEUTIC ABORTION     multiple  . TUBAL LIGATION      OB History    Gravida  12   Para  5   Term  5   Preterm      AB  6   Living  5     SAB  3   TAB  3   Ectopic      Multiple      Live Births  5            Home Medications    Prior to Admission medications   Medication Sig Start Date End Date Taking? Authorizing Provider  fluticasone (FLONASE) 50 MCG/ACT nasal spray Place 2 sprays into both nostrils daily. 10/01/18   Eustace Moore, MD  loratadine-pseudoephedrine (CLARITIN-D 12 HOUR) 5-120 MG tablet Take 1 tablet by mouth 2 (two) times daily. 10/01/18   Eustace Moore, MD  triamcinolone cream (KENALOG) 0.1 % Apply 1 application topically 2 (two) times daily. 09/19/18   Wieters, Junius Creamer, PA-C    Family History Family History  Problem Relation Age of Onset  . Hypertension Father     Social  History Social History   Tobacco Use  . Smoking status: Current Every Day Smoker    Packs/day: 0.25    Years: 12.00    Pack years: 3.00    Types: Cigarettes  . Smokeless tobacco: Never Used  Substance Use Topics  . Alcohol use: Yes    Comment: occasional  . Drug use: Yes    Types: Marijuana    Comment: Smokes marijuana "all day every day"     Allergies   Patient has no known allergies.   Review of Systems Review of Systems  Constitutional: Negative for chills and fever.  HENT: Positive for congestion, postnasal drip and rhinorrhea. Negative for ear pain and sore throat.   Eyes: Negative for pain and visual disturbance.  Respiratory: Positive for cough. Negative for shortness of breath.   Cardiovascular: Negative for chest pain and palpitations.  Gastrointestinal: Negative for abdominal pain and vomiting.  Genitourinary: Negative for dysuria and hematuria.  Musculoskeletal: Negative for arthralgias and back pain.  Skin: Negative for  color change and rash.  Neurological: Negative for seizures, syncope and headaches.  All other systems reviewed and are negative.    Physical Exam Triage Vital Signs ED Triage Vitals  Enc Vitals Group     BP 10/01/18 1525 119/78     Pulse Rate 10/01/18 1525 100     Resp 10/01/18 1525 18     Temp 10/01/18 1525 (!) 81 F (27.2 C)corrected below     Temp Source 10/01/18 1525 Oral     SpO2 10/01/18 1525 100 %   No data found.  Updated Vital Signs BP 119/78 (BP Location: Left Arm)   Pulse 100   Temp 98.1 F (36.7 C) (Oral)   Resp 18   SpO2 100%       Physical Exam Constitutional:      General: She is not in acute distress.    Appearance: She is well-developed.  HENT:     Head: Normocephalic and atraumatic.     Right Ear: Tympanic membrane and ear canal normal.     Left Ear: Tympanic membrane and ear canal normal.     Nose: Congestion and rhinorrhea present.     Mouth/Throat:     Mouth: Mucous membranes are moist.      Pharynx: Posterior oropharyngeal erythema present.     Comments: Tonsils moderately large.  Pink.  No exudate Eyes:     Conjunctiva/sclera: Conjunctivae normal.     Pupils: Pupils are equal, round, and reactive to light.  Neck:     Musculoskeletal: Normal range of motion.  Cardiovascular:     Rate and Rhythm: Normal rate.  Pulmonary:     Effort: Pulmonary effort is normal. No respiratory distress.  Abdominal:     General: There is no distension.     Palpations: Abdomen is soft.  Musculoskeletal: Normal range of motion.  Skin:    General: Skin is warm and dry.  Neurological:     Mental Status: She is alert.      UC Treatments / Results  Labs (all labs ordered are listed, but only abnormal results are displayed) Labs Reviewed - No data to display  EKG None  Radiology No results found.  Procedures Procedures (including critical care time)  Medications Ordered in UC Medications - No data to display  Initial Impression / Assessment and Plan / UC Course  I have reviewed the triage vital signs and the nursing notes.  Pertinent labs & imaging results that were available during my care of the patient were reviewed by me and considered in my medical decision making (see chart for details).      Final Clinical Impressions(s) / UC Diagnoses   Final diagnoses:  Environmental allergies     Discharge Instructions     Take Claritin-D in the morning. Take 1 or 2 Benadryl at bedtime. Use the Flonase 2 sprays into each nostril once a day Continue medications until symptoms have improved    ED Prescriptions    Medication Sig Dispense Auth. Provider   loratadine-pseudoephedrine (CLARITIN-D 12 HOUR) 5-120 MG tablet Take 1 tablet by mouth 2 (two) times daily. 30 tablet Eustace MooreNelson, Aylla Huffine Sue, MD   fluticasone [facility](FLONASE) 50 MCG/ACT nasal spray Place 2 sprays into both nostrils daily. 16 g Eustace MooreNelson, Ellana Kawa Sue, MD     Controlled Substance Prescriptions [place] Controlled Substance  Registry consulted? Not Applicable   Eustace MooreNelson, Ellyana Crigler Sue, MD 10/01/18 1718

## 2018-10-01 NOTE — Discharge Instructions (Signed)
Take Claritin-D in the morning. Take 1 or 2 Benadryl at bedtime. Use the Flonase 2 sprays into each nostril once a day Continue medications until symptoms have improved

## 2018-10-29 ENCOUNTER — Encounter (HOSPITAL_COMMUNITY): Payer: Self-pay

## 2018-10-29 ENCOUNTER — Other Ambulatory Visit: Payer: Self-pay

## 2018-10-29 ENCOUNTER — Ambulatory Visit (HOSPITAL_COMMUNITY)
Admission: EM | Admit: 2018-10-29 | Discharge: 2018-10-29 | Disposition: A | Discharge status: Home / Self Care | Payer: Self-pay | Attending: Family Medicine | Admitting: Family Medicine

## 2018-10-29 DIAGNOSIS — L299 Pruritus, unspecified: Secondary | ICD-10-CM

## 2018-10-29 MED ORDER — HYDROXYZINE HCL 25 MG PO TABS: 25.0000 mg | ORAL_TABLET | ORAL | 0 refills | Status: DC | PRN

## 2018-10-29 NOTE — Discharge Instructions (Addendum)
Take the hydroxyzine as needed for itching.  Take one or two at night for sleep

## 2018-10-29 NOTE — ED Triage Notes (Signed)
Patient presents to Urgent Care with complaints of itching after returning home from work since the past 2 nights. 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

## 2018-10-29 NOTE — ED Provider Notes (Signed)
[place]    CSN: [id number] Arrival date & time: 10/29/18  1504      History   Chief Complaint Chief Complaint  Patient presents with  . Pruritis    HPI Diana Oconnell is a 48 y.o. female.   HPI  Patient is here for severe itching.  It appears to bother her at night.  She thinks it may be dust or something at her workplace.  No new foods.  No new medicines.  No rash or bumps on her skin.  She states the more she scratches, the more it itches.  She has no Underlying medical problems, Kidney disease, no liver disease, no heavy drinking, no hypertension.  She has not tried any medicine for the itching.  Past Medical History:  Diagnosis Date  . Anemia   . Anxiety   . Depression   . Infection    UTI  . Pancreatic cyst     There are no active problems to display for this patient.   Past Surgical History:  Procedure Laterality Date  . DILATION AND CURETTAGE OF UTERUS    . THERAPEUTIC ABORTION     multiple  . TUBAL LIGATION      OB History    Gravida  12   Para  5   Term  5   Preterm      AB  6   Living  5     SAB  3   TAB  3   Ectopic      Multiple      Live Births  5            Home Medications    Prior to Admission medications   Medication Sig Start Date End Date Taking? Authorizing Provider  fluticasone (FLONASE) 50 MCG/ACT nasal spray Place 2 sprays into both nostrils daily. 10/01/18   Raylene Everts, MD  hydrOXYzine (ATARAX/VISTARIL) 25 MG tablet Take 1-2 tablets (25-50 mg total) by mouth every 4 (four) hours as needed. 10/29/18   Raylene Everts, MD  loratadine-pseudoephedrine (CLARITIN-D 12 HOUR) 5-120 MG tablet Take 1 tablet by mouth 2 (two) times daily. 10/01/18   Raylene Everts, MD  triamcinolone cream (KENALOG) 0.1 % Apply 1 application topically 2 (two) times daily. 09/19/18   Wieters, Elesa Hacker, PA-C    Family History Family History  Problem Relation Age of Onset  . Healthy Mother   . Hypertension  Father     Social History Social History   Tobacco Use  . Smoking status: Current Every Day Smoker    Packs/day: 0.25    Years: 12.00    Pack years: 3.00    Types: Cigarettes  . Smokeless tobacco: Never Used  Substance Use Topics  . Alcohol use: Yes    Comment: occasional  . Drug use: Yes    Types: Marijuana    Comment: Smokes marijuana "all day every day"     Allergies   Patient has no known allergies.   Review of Systems Review of Systems  Constitutional: Negative for chills and fever.  HENT: Negative for ear pain and sore throat.   Eyes: Negative for pain and visual disturbance.  Respiratory: Negative for cough and shortness of breath.   Cardiovascular: Negative for chest pain and palpitations.  Gastrointestinal: Negative for abdominal pain and vomiting.  Genitourinary: Negative for dysuria and hematuria.  Musculoskeletal: Negative for arthralgias and back pain.  Skin: Negative.  Negative for color change, pallor, rash and wound.  Neurological: Negative for seizures and syncope.  All other systems reviewed and are negative.    Physical Exam Triage Vital Signs ED Triage Vitals  Enc Vitals Group     BP 10/29/18 1526 (!) 102/59     Pulse Rate 10/29/18 1526 83     Resp 10/29/18 1526 18     Temp 10/29/18 1526 98.6 F (37 C)     Temp Source 10/29/18 1526 Oral     SpO2 10/29/18 1526 100 %     Weight --      Height --      Head Circumference --      Peak Flow --      Pain Score 10/29/18 1525 0     Pain Loc --      Pain Edu? --      Excl. in GC? --    No data found.  Updated Vital Signs BP (!) 102/59 (BP Location: Right Arm)   Pulse 83   Temp 98.6 F (37 C) (Oral)   Resp 18   LMP 10/03/2018 (Exact Date)   SpO2 100%   Visual Acuity Right Eye Distance:   Left Eye Distance:   Bilateral Distance:    Right Eye Near:   Left Eye Near:    Bilateral Near:     Physical Exam Constitutional:      General: She is not in acute distress.    Appearance:  She is well-developed.  HENT:     Head: Normocephalic and atraumatic.  Eyes:     Conjunctiva/sclera: Conjunctivae normal.     Pupils: Pupils are equal, round, and reactive to light.  Neck:     Musculoskeletal: Normal range of motion.  Cardiovascular:     Rate and Rhythm: Normal rate.  Pulmonary:     Effort: Pulmonary effort is normal. No respiratory distress.  Abdominal:     General: There is no distension.     Palpations: Abdomen is soft.  Musculoskeletal: Normal range of motion.  Skin:    General: Skin is warm and dry.     Comments: Skin is clear well-hydrated  Neurological:     Mental Status: She is alert.      UC Treatments / Results  Labs (all labs ordered are listed, but only abnormal results are displayed) Labs Reviewed - No data to display  EKG None  Radiology No results found.  Procedures Procedures (including critical care time)  Medications Ordered in UC Medications - No data to display  Initial Impression / Assessment and Plan / UC Course  I have reviewed the triage vital signs and the nursing notes.  Pertinent labs & imaging results that were available during my care of the patient were reviewed by me and considered in my medical decision making (see chart for details).      Final Clinical Impressions(s) / UC Diagnoses   Final diagnoses:  Pruritus     Discharge Instructions     Take the hydroxyzine as needed for itching.  Take one or two at night for sleep   ED Prescriptions    Medication Sig Dispense Auth. Provider   hydrOXYzine (ATARAX/VISTARIL) 25 MG tablet Take 1-2 tablets (25-50 mg total) by mouth every 4 (four) hours as needed. 50 tablet Eustace MooreNelson, [place] Dominique Sue, MD     Controlled Substance Prescriptions [place] Controlled Substance Registry consulted? Not Applicable   Eustace MooreNelson, Nkechi Linehan Sue, MD 10/29/18 1627

## 2018-11-12 ENCOUNTER — Ambulatory Visit (HOSPITAL_COMMUNITY)
Admission: EM | Admit: 2018-11-12 | Discharge: 2018-11-12 | Disposition: A | Discharge status: Home / Self Care | Payer: Self-pay | Attending: Family Medicine | Admitting: Family Medicine

## 2018-11-12 ENCOUNTER — Other Ambulatory Visit: Payer: Self-pay

## 2018-11-12 ENCOUNTER — Encounter (HOSPITAL_COMMUNITY): Payer: Self-pay

## 2018-11-12 DIAGNOSIS — R519 Headache, unspecified: Secondary | ICD-10-CM

## 2018-11-12 DIAGNOSIS — R51 Headache: Secondary | ICD-10-CM

## 2018-11-12 DIAGNOSIS — R42 Dizziness and giddiness: Secondary | ICD-10-CM

## 2018-11-12 MED ORDER — ONDANSETRON HCL 4 MG PO TABS: 4.0000 mg | ORAL_TABLET | Freq: Four times a day (QID) | ORAL | 0 refills | Status: DC

## 2018-11-12 MED ORDER — KETOROLAC TROMETHAMINE 60 MG/2ML IM SOLN
INTRAMUSCULAR | Status: AC
  Filled 2018-11-12: qty 2

## 2018-11-12 MED ORDER — KETOROLAC TROMETHAMINE 60 MG/2ML IM SOLN
60.0000 mg | Freq: Once | INTRAMUSCULAR | Status: AC
  Administered 2018-11-12: 13:00:00 60 mg via INTRAMUSCULAR

## 2018-11-12 MED ORDER — ONDANSETRON 4 MG PO TBDP
4.0000 mg | ORAL_TABLET | Freq: Once | ORAL | Status: AC
  Administered 2018-11-12: 13:00:00 4 mg via ORAL

## 2018-11-12 MED ORDER — ONDANSETRON 4 MG PO TBDP
ORAL_TABLET | ORAL | Status: AC
  Filled 2018-11-12: qty 1

## 2018-11-12 MED ORDER — BUTALBITAL-APAP-CAFFEINE 50-325-40 MG PO TABS: 1.0000 | ORAL_TABLET | Freq: Four times a day (QID) | ORAL | 0 refills | Status: AC | PRN

## 2018-11-12 NOTE — ED Provider Notes (Signed)
[place]    CSN: [id number] Arrival date & time: 11/12/18  1154      History   Chief Complaint Chief Complaint  Patient presents with   Headache   Dizziness    HPI Tallulah Hosman is a 48 y.o. female.   HPI  Headache since yesterday.  Nausea.  Vomiting.  Light sensitivity.  Feels slightly dizzy.  Does not usually have migraines.  States she has had "1 or 2".  She states she is under stress.  No sinus symptoms or infection.  No head trauma.  No fall or injury.  She denies any changes in mentation, cognition, speech, balance or ambulation  Past Medical History:  Diagnosis Date   Anemia    Anxiety    Depression    Infection    UTI   Pancreatic cyst     There are no active problems to display for this patient.   Past Surgical History:  Procedure Laterality Date   DILATION AND CURETTAGE OF UTERUS     THERAPEUTIC ABORTION     multiple   TUBAL LIGATION      OB History    Gravida  12   Para  5   Term  5   Preterm      AB  6   Living  5     SAB  3   TAB  3   Ectopic      Multiple      Live Births  5            Home Medications    Prior to Admission medications   Medication Sig Start Date End Date Taking? Authorizing Provider  butalbital-acetaminophen-caffeine (FIORICET) [phone] MG tablet Take 1-2 tablets by mouth every 6 (six) hours as needed for headache. 11/12/18 11/12/19  Raylene Everts, MD  fluticasone [facility]) 50 MCG/ACT nasal spray Place 2 sprays into both nostrils daily. 10/01/18   Raylene Everts, MD  hydrOXYzine (ATARAX/VISTARIL) 25 MG tablet Take 1-2 tablets (25-50 mg total) by mouth every 4 (four) hours as needed. 10/29/18   Raylene Everts, MD  loratadine-pseudoephedrine (CLARITIN-D 12 HOUR) 5-120 MG tablet Take 1 tablet by mouth 2 (two) times daily. 10/01/18   Raylene Everts, MD  ondansetron (ZOFRAN) 4 MG tablet Take 1 tablet (4 mg total) by mouth every 6 (six) hours. 11/12/18   Raylene Everts, MD  triamcinolone cream (KENALOG) 0.1 % Apply 1 application topically 2 (two) times daily. 09/19/18   Wieters, Elesa Hacker, PA-C    Family History Family History  Problem Relation Age of Onset   Healthy Mother    Hypertension Father     Social History Social History   Tobacco Use   Smoking status: Current Every Day Smoker    Packs/day: 0.25    Years: 12.00    Pack years: 3.00    Types: Cigarettes   Smokeless tobacco: Never Used  Substance Use Topics   Alcohol use: Yes    Comment: occasional   Drug use: Yes    Types: Marijuana    Comment: Smokes marijuana "all day every day"     Allergies   Patient has no known allergies.   Review of Systems Review of Systems  Constitutional: Negative for chills and fever.  HENT: Negative for ear pain and sore throat.   Eyes: Positive for photophobia. Negative for pain and visual disturbance.  Respiratory: Negative for cough and shortness of breath.   Cardiovascular: Negative for chest  pain and palpitations.  Gastrointestinal: Positive for nausea. Negative for abdominal pain and vomiting.  Genitourinary: Negative for dysuria and hematuria.  Musculoskeletal: Negative for arthralgias and back pain.  Skin: Negative for color change and rash.  Neurological: Positive for dizziness and headaches. Negative for seizures and syncope.  All other systems reviewed and are negative.    Physical Exam Triage Vital Signs ED Triage Vitals  Enc Vitals Group     BP 11/12/18 1216 106/70     Pulse Rate 11/12/18 1216 68     Resp 11/12/18 1216 17     Temp 11/12/18 1216 98.4 F (36.9 C)     Temp Source 11/12/18 1216 Oral     SpO2 11/12/18 1216 98 %     Weight --      Height --      Head Circumference --      Peak Flow --      Pain Score 11/12/18 1217 8     Pain Loc --      Pain Edu? --      Excl. in GC? --    No data found.  Updated Vital Signs BP 106/70 (BP Location: Left Arm)    Pulse 68    Temp 98.4 F (36.9 C) (Oral)     Resp 17    LMP 11/01/2018    SpO2 98%       Bilateral Near:     Physical Exam Constitutional:      General: She is not in acute distress.    Appearance: She is well-developed and normal weight. She is ill-appearing.     Comments: Lights out.  Appears uncomfortable  HENT:     Head: Normocephalic and atraumatic.  Eyes:     General: No visual field deficit.    Extraocular Movements: Extraocular movements intact.     Right eye: Normal extraocular motion and no nystagmus.     Left eye: Normal extraocular motion and no nystagmus.     Conjunctiva/sclera: Conjunctivae normal.     Pupils: Pupils are equal, round, and reactive to light.     Right eye: Pupil is round and reactive.     Left eye: Pupil is round and reactive.  Neck:     Musculoskeletal: Normal range of motion. No neck rigidity.  Cardiovascular:     Rate and Rhythm: Normal rate and regular rhythm.     Heart sounds: Normal heart sounds.  Pulmonary:     Effort: Pulmonary effort is normal. No respiratory distress.     Breath sounds: Normal breath sounds.  Abdominal:     General: There is no distension.     Palpations: Abdomen is soft.  Musculoskeletal: Normal range of motion.  Lymphadenopathy:     Cervical: No cervical adenopathy.  Skin:    General: Skin is warm and dry.  Neurological:     Mental Status: She is alert.     Cranial Nerves: No cranial nerve deficit, dysarthria or facial asymmetry.     Motor: No weakness.     Coordination: Romberg sign negative. Coordination normal.     Gait: Gait normal.     Deep Tendon Reflexes: Reflexes normal.  Psychiatric:        Mood and Affect: Mood normal.        Speech: Speech normal.      UC Treatments / Results  Labs (all labs ordered are listed, but only abnormal results are displayed) Labs Reviewed - No data to display  EKG None  Radiology No results found.  Procedures Procedures (including critical care time)  Medications Ordered in UC Medications    ondansetron (ZOFRAN-ODT) disintegrating tablet 4 mg (4 mg Oral Given 11/12/18 1251)  ketorolac (TORADOL) injection 60 mg (60 mg Intramuscular Given 11/12/18 1251)  ondansetron (ZOFRAN-ODT) 4 MG disintegrating tablet (has no administration in time range)  ketorolac (TORADOL) 60 MG/2ML injection (has no administration in time range)    Initial Impression / Assessment and Plan / UC Course  I have reviewed the triage vital signs and the nursing notes.  Pertinent labs & imaging results that were available during my care of the patient were reviewed by me and considered in my medical decision making (see chart for details).     Stable neurological exam.  Headache.  Responded to Toradol and Zofran.  Sent home with medication.  Work note Final Clinical Impressions(s) / UC Diagnoses   Final diagnoses:  Bad headache  Dizziness     Discharge Instructions     Go home and rest Take Zofran as needed for nausea and vomiting.  It is important that you increase your fluids. Take Fioricet if needed for headache. Try to sleep Add bland diet as soon as you are able to tolerate.  Avoid fried or spicy foods for a couple days. You should be able to return to work day after tomorrow.  Call for problems    ED Prescriptions    Medication Sig Dispense Auth. Provider   ondansetron (ZOFRAN) 4 MG tablet Take 1 tablet (4 mg total) by mouth every 6 (six) hours. 12 tablet Eustace MooreNelson, Malkie Wille Sue, MD   butalbital-acetaminophen-caffeine (FIORICET) [phone] MG tablet Take 1-2 tablets by mouth every 6 (six) hours as needed for headache. 20 tablet Eustace MooreNelson, Gates Jividen Sue, MD     Controlled Substance Prescriptions [place] Controlled Substance Registry consulted? Yes, I have consulted the [place] Controlled Substances Registry for this patient, and feel the risk/benefit ratio today is favorable for proceeding with this prescription for a controlled substance.   Eustace MooreNelson, Jatziry Wechter Sue, MD 11/12/18 2026

## 2018-11-12 NOTE — ED Triage Notes (Signed)
Pt presents with ongoing headache since yesterday and dizziness.

## 2018-11-12 NOTE — Discharge Instructions (Signed)
Go home and rest Take Zofran as needed for nausea and vomiting.  It is important that you increase your fluids. Take Fioricet if needed for headache. Try to sleep Add bland diet as soon as you are able to tolerate.  Avoid fried or spicy foods for a couple days. You should be able to return to work day after tomorrow.  Call for problems

## 2018-12-21 ENCOUNTER — Encounter (HOSPITAL_COMMUNITY): Payer: Self-pay | Admitting: *Deleted

## 2018-12-21 ENCOUNTER — Other Ambulatory Visit: Payer: Self-pay

## 2018-12-21 ENCOUNTER — Ambulatory Visit (HOSPITAL_COMMUNITY)
Admission: EM | Admit: 2018-12-21 | Discharge: 2018-12-21 | Disposition: A | Discharge status: Home / Self Care | Payer: Self-pay | Attending: Family Medicine | Admitting: Family Medicine

## 2018-12-21 DIAGNOSIS — Z20822 Contact with and (suspected) exposure to covid-19: Secondary | ICD-10-CM

## 2018-12-21 DIAGNOSIS — R51 Headache: Secondary | ICD-10-CM

## 2018-12-21 DIAGNOSIS — J029 Acute pharyngitis, unspecified: Secondary | ICD-10-CM

## 2018-12-21 DIAGNOSIS — Z20828 Contact with and (suspected) exposure to other viral communicable diseases: Secondary | ICD-10-CM | POA: Insufficient documentation

## 2018-12-21 DIAGNOSIS — R6883 Chills (without fever): Secondary | ICD-10-CM

## 2018-12-21 LAB — POCT RAPID STREP A: Streptococcus, Group A Screen (Direct): NEGATIVE

## 2018-12-21 NOTE — ED Provider Notes (Signed)
[facility]    CSN: [id number] Arrival date & time: 12/21/18  1236     History   Chief Complaint No chief complaint on file.   HPI Diana Oconnell is a 48 y.o. female.   Patient is a 48 year old female that presents today with headache, sore throat, chills, fever.  Symptoms have been constant, worsening over the past couple days.  She has had fever up to 101.2.  Took Tylenol at 2 AM this morning.  Reports a coworker tested positive for COVID and worried about exposure.  Denies any chest pain, shortness of breath.   ROS per HPI      Past Medical History:  Diagnosis Date  . Anemia   . Anxiety   . Depression   . Infection    UTI  . Pancreatic cyst     There are no active problems to display for this patient.   Past Surgical History:  Procedure Laterality Date  . DILATION AND CURETTAGE OF UTERUS    . THERAPEUTIC ABORTION     multiple  . TUBAL LIGATION      OB History    Gravida  12   Para  5   Term  5   Preterm      AB  6   Living  5     SAB  3   TAB  3   Ectopic      Multiple      Live Births  5            Home Medications    Prior to Admission medications   Medication Sig Start Date End Date Taking? Authorizing Provider  butalbital-acetaminophen-caffeine (FIORICET) [phone] MG tablet Take 1-2 tablets by mouth every 6 (six) hours as needed for headache. 11/12/18 11/12/19  Eustace MooreNelson, Yvonne Sue, MD  fluticasone [facility](FLONASE) 50 MCG/ACT nasal spray Place 2 sprays into both nostrils daily. 10/01/18   Eustace MooreNelson, Yvonne Sue, MD  hydrOXYzine (ATARAX/VISTARIL) 25 MG tablet Take 1-2 tablets (25-50 mg total) by mouth every 4 (four) hours as needed. 10/29/18   Eustace MooreNelson, Yvonne Sue, MD  loratadine-pseudoephedrine (CLARITIN-D 12 HOUR) 5-120 MG tablet Take 1 tablet by mouth 2 (two) times daily. 10/01/18   Eustace MooreNelson, Yvonne Sue, MD  ondansetron (ZOFRAN) 4 MG tablet Take 1 tablet (4 mg total) by mouth every 6 (six) hours. 11/12/18   Eustace MooreNelson, Yvonne Sue, MD   triamcinolone cream (KENALOG) 0.1 % Apply 1 application topically 2 (two) times daily. 09/19/18   Wieters, Junius CreamerHallie C, PA-C    Family History Family History  Problem Relation Age of Onset  . Healthy Mother   . Hypertension Father     Social History Social History   Tobacco Use  . Smoking status: Current Every Day Smoker    Packs/day: 0.25    Years: 12.00    Pack years: 3.00    Types: Cigarettes  . Smokeless tobacco: Never Used  Substance Use Topics  . Alcohol use: Yes    Comment: occasional  . Drug use: Yes    Types: Marijuana    Comment: Smokes marijuana "all day every day"     Allergies   Patient has no known allergies.   Review of Systems Review of Systems   Physical Exam Triage Vital Signs ED Triage Vitals  Enc Vitals Group     BP 12/21/18 1254 97/66     Pulse Rate 12/21/18 1254 85     Resp 12/21/18 1254 16     Temp 12/21/18 1254  98.2 F (36.8 C)     Temp Source 12/21/18 1254 Other     SpO2 12/21/18 1254 100 %     Weight --      Height --      Head Circumference --      Peak Flow --      Pain Score 12/21/18 1255 5     Pain Loc --      Pain Edu? --      Excl. in GC? --    No data found.  Updated Vital Signs BP 97/66   Pulse 85   Temp 98.2 F (36.8 C) (Other (Comment))   Resp 16   LMP 12/01/2018 (Exact Date)   SpO2 100%   Breastfeeding No   Visual Acuity Right Eye Distance:   Left Eye Distance:   Bilateral Distance:    Right Eye Near:   Left Eye Near:    Bilateral Near:     Physical Exam Vitals signs and nursing note reviewed.  Constitutional:      Appearance: She is ill-appearing.  HENT:     Head: Normocephalic and atraumatic.     Right Ear: Tympanic membrane and ear canal normal.     Left Ear: Tympanic membrane and ear canal normal.     Nose: Nose normal.     Mouth/Throat:     Pharynx: Oropharynx is clear.  Eyes:     Conjunctiva/sclera: Conjunctivae normal.  Cardiovascular:     Rate and Rhythm: Normal rate and regular  rhythm.     Pulses: Normal pulses.     Heart sounds: Normal heart sounds.  Pulmonary:     Effort: Pulmonary effort is normal.     Breath sounds: Normal breath sounds.  Musculoskeletal: Normal range of motion.  Lymphadenopathy:     Cervical: Cervical adenopathy present.  Skin:    General: Skin is warm and dry.  Neurological:     Mental Status: She is alert.  Psychiatric:        Mood and Affect: Mood normal.      UC Treatments / Results  Labs (all labs ordered are listed, but only abnormal results are displayed) Labs Reviewed  NOVEL CORONAVIRUS, NAA (HOSPITAL ORDER, SEND-OUT TO REF LAB)  CULTURE, GROUP A STREP [facility])  POCT RAPID STREP A    EKG   Radiology No results found.  Procedures Procedures (including critical care time)  Medications Ordered in UC Medications - No data to display  Initial Impression / Assessment and Plan / UC Course  I have reviewed the triage vital signs and the nursing notes.  Pertinent labs & imaging results that were available during my care of the patient were reviewed by me and considered in my medical decision making (see chart for details).     Highly suspicious for COVID based on exposure and symptoms.  Sending swab for testing with labs pending. Quarantine precautions given Over-the-counter medications as needed for symptoms. Follow up as needed for continued or worsening symptoms  Final Clinical Impressions(s) / UC Diagnoses   Final diagnoses:  Exposure to Covid-19 Virus     Discharge Instructions     Your rapid strep test was negative.  We will send for culture. Highly suspicious for COVID at this time.  Make sure you are quarantining and taking precautions. You can take over-the-counter medication as needed for symptoms. We will call you with any positive results    ED Prescriptions    None     Controlled Substance Prescriptions  [place] Controlled Substance Registry consulted? Not Applicable   Orvan July, NP  12/21/18 1507

## 2018-12-21 NOTE — Discharge Instructions (Addendum)
Your rapid strep test was negative.  We will send for culture. Highly suspicious for COVID at this time.  Make sure you are quarantining and taking precautions. You can take over-the-counter medication as needed for symptoms. We will call you with any positive results

## 2018-12-21 NOTE — ED Triage Notes (Signed)
Reports a coworker has tested positive for Covid.  Started HAs 7 days ago.  Started with Sore throat, chills, fever up to 101.2, 3 days ago.  Last dose of antipyretics @ 0200 today.

## 2018-12-23 LAB — CULTURE, GROUP A STREP (THRC)

## 2018-12-23 LAB — NOVEL CORONAVIRUS, NAA (HOSP ORDER, SEND-OUT TO REF LAB; TAT 18-24 HRS): SARS-CoV-2, NAA: NOT DETECTED

## 2018-12-24 ENCOUNTER — Telehealth (HOSPITAL_COMMUNITY): Payer: Self-pay | Admitting: Emergency Medicine

## 2018-12-24 ENCOUNTER — Encounter (HOSPITAL_COMMUNITY): Payer: Self-pay

## 2018-12-24 NOTE — Telephone Encounter (Signed)
Covid Test Negative, Pt notified in [place].  Few non group A strep on culture, attempted to call patient to see how she was feeling, no answer.

## 2019-02-01 ENCOUNTER — Ambulatory Visit (HOSPITAL_COMMUNITY)
Admission: EM | Admit: 2019-02-01 | Discharge: 2019-02-01 | Disposition: A | Discharge status: Home / Self Care | Payer: Self-pay | Attending: Urgent Care | Admitting: Urgent Care

## 2019-02-01 ENCOUNTER — Encounter (HOSPITAL_COMMUNITY): Payer: Self-pay

## 2019-02-01 ENCOUNTER — Other Ambulatory Visit: Payer: Self-pay

## 2019-02-01 DIAGNOSIS — H6982 Other specified disorders of Eustachian tube, left ear: Secondary | ICD-10-CM

## 2019-02-01 DIAGNOSIS — H9202 Otalgia, left ear: Secondary | ICD-10-CM

## 2019-02-01 DIAGNOSIS — R0989 Other specified symptoms and signs involving the circulatory and respiratory systems: Secondary | ICD-10-CM

## 2019-02-01 DIAGNOSIS — J3089 Other allergic rhinitis: Secondary | ICD-10-CM

## 2019-02-01 MED ORDER — IBUPROFEN 800 MG PO TABS
ORAL_TABLET | ORAL | Status: AC
  Filled 2019-02-01: qty 1

## 2019-02-01 MED ORDER — AZITHROMYCIN 250 MG PO TABS: ORAL_TABLET | ORAL | 0 refills | Status: DC

## 2019-02-01 MED ORDER — NAPROXEN 500 MG PO TABS: 500.0000 mg | ORAL_TABLET | Freq: Two times a day (BID) | ORAL | 0 refills | Status: DC

## 2019-02-01 MED ORDER — CETIRIZINE HCL 10 MG PO TABS: 10.0000 mg | ORAL_TABLET | Freq: Every day | ORAL | 0 refills | Status: DC

## 2019-02-01 MED ORDER — PSEUDOEPHEDRINE HCL 60 MG PO TABS: 60.0000 mg | ORAL_TABLET | Freq: Three times a day (TID) | ORAL | 0 refills | Status: DC | PRN

## 2019-02-01 MED ORDER — IBUPROFEN 800 MG PO TABS: 800.0000 mg | ORAL_TABLET | Freq: Once | ORAL | Status: DC

## 2019-02-01 NOTE — ED Triage Notes (Signed)
Pt states she is having left ear pain x 2 days

## 2019-02-01 NOTE — ED Provider Notes (Signed)
MRN: [id number] DOB: 25-Dec-1970  Subjective:   Diana Oconnell is a 48 y.o. female presenting for 2 to 3-day history of cute onset worsening sharp persistent left ear pain with runny stuffy nose of the left side.  Patient has a history of allergies but is not taking anything consistently for this.     No Known Allergies  Past Medical History:  Diagnosis Date  . Anemia   . Anxiety   . Depression   . Infection    UTI  . Pancreatic cyst      Past Surgical History:  Procedure Laterality Date  . DILATION AND CURETTAGE OF UTERUS    . THERAPEUTIC ABORTION     multiple  . TUBAL LIGATION      ROS Denies fever, eye pain, eye drainage, sinus pain, ear drainage, tinnitus, decreased hearing, sore throat, cough, chest pain, nausea, vomiting, belly pain.  Objective:   Vitals: BP 96/67 (BP Location: Right Arm)   Pulse 87   Temp 98.5 F (36.9 C) (Oral)   Resp 18   Wt 145 lb (65.8 kg)   LMP 02/01/2019   SpO2 100%   BMI 22.71 kg/m   Physical Exam Constitutional:      General: She is not in acute distress.    Appearance: Normal appearance. She is well-developed. She is ill-appearing. She is not toxic-appearing or diaphoretic.  HENT:     Head: Normocephalic and atraumatic.     Right Ear: Ear canal and external ear normal. No drainage or tenderness. No middle ear effusion. There is no impacted cerumen. Tympanic membrane is not erythematous.     Left Ear: Ear canal normal. No drainage or tenderness.  No middle ear effusion. There is impacted cerumen (Mildly impacted inferiorly). Tympanic membrane is not erythematous.     Ears:     Comments: TMs opacified bilaterally.    Nose: Nose normal. No congestion or rhinorrhea.     Mouth/Throat:     Mouth: Mucous membranes are moist. No oral lesions.     Pharynx: Oropharynx is clear. No pharyngeal swelling, oropharyngeal exudate, posterior oropharyngeal erythema or uvula swelling.     Tonsils: No tonsillar exudate or tonsillar  abscesses.  Eyes:     General: No scleral icterus.    Extraocular Movements: Extraocular movements intact.     Right eye: Normal extraocular motion.     Left eye: Normal extraocular motion.     Conjunctiva/sclera: Conjunctivae normal.     Pupils: Pupils are equal, round, and reactive to light.  Neck:     Musculoskeletal: Normal range of motion and neck supple.  Cardiovascular:     Rate and Rhythm: Normal rate.  Pulmonary:     Effort: Pulmonary effort is normal.  Lymphadenopathy:     Cervical: No cervical adenopathy.  Skin:    General: Skin is warm and dry.  Neurological:     General: No focal deficit present.     Mental Status: She is alert and oriented to person, place, and time.     Cranial Nerves: No cranial nerve deficit.     Motor: No weakness.     Coordination: Coordination normal.     Deep Tendon Reflexes: Reflexes normal.  Psychiatric:        Mood and Affect: Mood normal.        Behavior: Behavior normal.        Thought Content: Thought content normal.        Judgment: Judgment normal.      Assessment  and Plan :   1. Left ear pain   2. Runny nose   3. Allergic rhinitis due to other allergic trigger, unspecified seasonality   4. Dysfunction of left eustachian tube     Counseled patient on differential which includes ETD, allergic rhinitis and less likely ear infection.  Recommended supportive care with Zyrtec, Sudafed and anti-inflammatories.  Provided patient with work note and a prescription for azithromycin should her ear pain persist in the next 2 to 3 days.  Azithromycin will address your infection.  Otherwise patient will discard the printed prescription.  Counseled patient on potential for adverse effects with medications prescribed/recommended today, ER and return-to-clinic precautions discussed, patient verbalized understanding.    Jaynee Eagles, [place] 02/01/19 1732

## 2019-02-01 NOTE — Discharge Instructions (Addendum)
We will manage this as eustachian tube dysfunction related to allergies/allergic rhinitis.  Start an antihistamine like Zyrtec, Allegra or Claritin for postnasal drainage, sinus congestion.  You can take this together with pseudoephedrine (Sudafed) at a dose of 60 mg 3 times a day oral 120 mg twice daily as needed for the same kind of congestion.  You can also use naproxen twice daily as needed for pain and inflammation.  If you do not have improvement in the next 2 to 3 days, then you can go ahead and start azithromycin to help with the middle ear infection.  Otherwise you do not have to take this medication at all.

## 2019-03-15 ENCOUNTER — Other Ambulatory Visit: Payer: Self-pay

## 2019-03-15 ENCOUNTER — Encounter (HOSPITAL_COMMUNITY): Payer: Self-pay

## 2019-03-15 ENCOUNTER — Ambulatory Visit (HOSPITAL_COMMUNITY)
Admission: EM | Admit: 2019-03-15 | Discharge: 2019-03-15 | Disposition: A | Discharge status: Home / Self Care | Payer: Self-pay

## 2019-03-15 DIAGNOSIS — K0889 Other specified disorders of teeth and supporting structures: Secondary | ICD-10-CM

## 2019-03-15 MED ORDER — CLINDAMYCIN HCL 300 MG PO CAPS: 300.0000 mg | ORAL_CAPSULE | Freq: Three times a day (TID) | ORAL | 0 refills | Status: DC

## 2019-03-15 MED ORDER — IBUPROFEN 800 MG PO TABS: 800.0000 mg | ORAL_TABLET | Freq: Three times a day (TID) | ORAL | 0 refills | Status: DC | PRN

## 2019-03-15 MED ORDER — HYDROCODONE-ACETAMINOPHEN 5-325 MG PO TABS: 2.0000 | ORAL_TABLET | ORAL | 0 refills | Status: DC | PRN

## 2019-03-15 NOTE — Discharge Instructions (Addendum)
Take the antibiotic as prescribed.  Take the ibuprofen as directed.  You can take the prescribed Norco as needed for severe pain; do not drive, operate machinery, or drink alcohol with this medication as it may cause drowsiness.    A dental resource guide is attached.  Please call to make an appointment with a dentist as soon as possible.    Return here or go to the emergency department if you develop increased pain, swelling, fever, chills, or other concerning symptoms.

## 2019-03-15 NOTE — ED Triage Notes (Addendum)
Pt states she is having dental pain x 2 week. Pt states she is having left sided headache x 2 week. Pt is taking ibuprofen, Naprosyn and Tylenol without symptoms relieve.

## 2019-03-15 NOTE — ED Provider Notes (Signed)
[place]    CSN: [id number] Arrival date & time: 03/15/19  1537      History   Chief Complaint Chief Complaint  Patient presents with  . Dental Pain  . Headache    HPI Diana Oconnell is a 48 y.o. female.   Patient reports tooth ache on her left upper and lower side x2 weeks.  She reports the pain is currently 10/10.  She states the pain has progressed to causing her headache and jaw pain.  She has taken ibuprofen, Naprosyn, Tylenol without relief.  She denies fever, chills, difficulty swallowing, difficulty breathing, or other symptoms.  LMP: 03/03/2019.  The history is provided by the patient.    Past Medical History:  Diagnosis Date  . Anemia   . Anxiety   . Depression   . Infection    UTI  . Pancreatic cyst     There are no active problems to display for this patient.   Past Surgical History:  Procedure Laterality Date  . DILATION AND CURETTAGE OF UTERUS    . THERAPEUTIC ABORTION     multiple  . TUBAL LIGATION      OB History    Gravida  12   Para  5   Term  5   Preterm      AB  6   Living  5     SAB  3   TAB  3   Ectopic      Multiple      Live Births  5            Home Medications    Prior to Admission medications   Medication Sig Start Date End Date Taking? Authorizing Provider  acetaminophen (TYLENOL) 500 MG tablet Take 500 mg by mouth every 6 (six) hours as needed.   Yes [provider]  azithromycin (ZITHROMAX) 250 MG tablet Start with 2 tablets today, then 1 daily thereafter. 02/01/19   Jaynee Eagles, PA-C  butalbital-acetaminophen-caffeine (FIORICET) [phone] MG tablet Take 1-2 tablets by mouth every 6 (six) hours as needed for headache. 11/12/18 11/12/19  Raylene Everts, MD  cetirizine (ZYRTEC) 10 MG tablet Take 1 tablet (10 mg total) by mouth daily. 02/01/19   Jaynee Eagles, PA-C  clindamycin (CLEOCIN) 300 MG capsule Take 1 capsule (300 mg total) by mouth 3 (three) times daily. 03/15/19   Sharion Balloon, NP  fluticasone (FLONASE) 50 MCG/ACT nasal spray Place 2 sprays into both nostrils daily. 10/01/18   Raylene Everts, MD  HYDROcodone-acetaminophen (NORCO/VICODIN) 5-325 MG tablet Take 2 tablets by mouth every 4 (four) hours as needed. 03/15/19   Sharion Balloon, NP  hydrOXYzine (ATARAX/VISTARIL) 25 MG tablet Take 1-2 tablets (25-50 mg total) by mouth every 4 (four) hours as needed. 10/29/18   Raylene Everts, MD  ibuprofen (ADVIL) 800 MG tablet Take 1 tablet (800 mg total) by mouth every 8 (eight) hours as needed. 03/15/19   Sharion Balloon, NP  loratadine-pseudoephedrine (CLARITIN-D 12 HOUR) 5-120 MG tablet Take 1 tablet by mouth 2 (two) times daily. 10/01/18   Raylene Everts, MD  naproxen (NAPROSYN) 500 MG tablet Take 1 tablet (500 mg total) by mouth 2 (two) times daily. 02/01/19   Jaynee Eagles, PA-C  ondansetron (ZOFRAN) 4 MG tablet Take 1 tablet (4 mg total) by mouth every 6 (six) hours. 11/12/18   Raylene Everts, MD  pseudoephedrine (SUDAFED) 60 MG tablet Take 1 tablet (60 mg total) by mouth every 8 (  eight) hours as needed for congestion. 02/01/19   Wallis BambergMani, Mario, PA-C  triamcinolone cream (KENALOG) 0.1 % Apply 1 application topically 2 (two) times daily. 09/19/18   Wieters, Junius CreamerHallie C, PA-C    Family History Family History  Problem Relation Age of Onset  . Healthy Mother   . Hypertension Father     Social History Social History   Tobacco Use  . Smoking status: Current Every Day Smoker    Packs/day: 0.25    Years: 12.00    Pack years: 3.00    Types: Cigarettes  . Smokeless tobacco: Never Used  Substance Use Topics  . Alcohol use: Yes    Comment: occasional  . Drug use: Yes    Types: Marijuana    Comment: Smokes marijuana "all day every day"     Allergies   Patient has no known allergies.   Review of Systems Review of Systems  Constitutional: Negative for chills and fever.  HENT: Positive for dental problem. Negative for ear pain, sore throat and trouble  swallowing.   Eyes: Negative for pain and visual disturbance.  Respiratory: Negative for cough and shortness of breath.   Cardiovascular: Negative for chest pain and palpitations.  Gastrointestinal: Negative for abdominal pain and vomiting.  Genitourinary: Negative for dysuria and hematuria.  Musculoskeletal: Negative for back pain.  Skin: Negative for color change and rash.  Neurological: Positive for headaches. Negative for seizures and syncope.  All other systems reviewed and are negative.    Physical Exam Triage Vital Signs ED Triage Vitals  Enc Vitals Group     BP 03/15/19 1630 106/69     Pulse Rate 03/15/19 1630 65     Resp 03/15/19 1630 15     Temp 03/15/19 1630 (!) 97.5 F (36.4 C)     Temp Source 03/15/19 1630 Temporal     SpO2 03/15/19 1630 100 %     Weight --      Height --      Head Circumference --      Peak Flow --      Pain Score 03/15/19 1628 10     Pain Loc --      Pain Edu? --      Excl. in GC? --    No data found.  Updated Vital Signs BP 106/69 (BP Location: Right Arm)   Pulse 65   Temp (!) 97.5 F (36.4 C) (Temporal)   Resp 15   LMP 03/03/2019   SpO2 100%   Visual Acuity Right Eye Distance:   Left Eye Distance:   Bilateral Distance:    Right Eye Near:   Left Eye Near:    Bilateral Near:     Physical Exam Vitals signs and nursing note reviewed.  Constitutional:      General: She is not in acute distress.    Appearance: She is well-developed.  HENT:     Head: Normocephalic and atraumatic.     Mouth/Throat:     Mouth: Mucous membranes are moist.     Dentition: Dental caries present.     Pharynx: Oropharynx is clear.   Eyes:     Conjunctiva/sclera: Conjunctivae normal.  Neck:     Musculoskeletal: Neck supple.  Cardiovascular:     Rate and Rhythm: Normal rate and regular rhythm.     Heart sounds: No murmur.  Pulmonary:     Effort: Pulmonary effort is normal. No respiratory distress.     Breath sounds: Normal breath sounds.   Abdominal:  Palpations: Abdomen is soft.     Tenderness: There is no abdominal tenderness. There is no guarding or rebound.  Skin:    General: Skin is warm and dry.  Neurological:     General: No focal deficit present.     Mental Status: She is alert and oriented to person, place, and time.      UC Treatments / Results  Labs (all labs ordered are listed, but only abnormal results are displayed) Labs Reviewed - No data to display  EKG   Radiology No results found.  Procedures Procedures (including critical care time)  Medications Ordered in UC Medications - No data to display  Initial Impression / Assessment and Plan / UC Course  I have reviewed the triage vital signs and the nursing notes.  Pertinent labs & imaging results that were available during my care of the patient were reviewed by me and considered in my medical decision making (see chart for details).    Dental pain.  Treating with clindamycin, ibuprofen, Norco.  Precautions for drowsiness with Norco discussed with patient.  Dental resource guide provided and patient instructed to call a dentist for the soonest available appointment.  Instructed her to return here or go to the ED if she has increased pain, swelling, fever, chills, or other concerning symptoms.  Patient agrees to plan of care.     Final Clinical Impressions(s) / UC Diagnoses   Final diagnoses:  Pain, dental     Discharge Instructions     Take the antibiotic as prescribed.  Take the ibuprofen as directed.  You can take the prescribed Norco as needed for severe pain; do not drive, operate machinery, or drink alcohol with this medication as it may cause drowsiness.    A dental resource guide is attached.  Please call to make an appointment with a dentist as soon as possible.    Return here or go to the emergency department if you develop increased pain, swelling, fever, chills, or other concerning symptoms.        ED Prescriptions     Medication Sig Dispense Auth. Provider   clindamycin (CLEOCIN) 300 MG capsule Take 1 capsule (300 mg total) by mouth 3 (three) times daily. 21 capsule Wendee Beavers H, NP   ibuprofen (ADVIL) 800 MG tablet Take 1 tablet (800 mg total) by mouth every 8 (eight) hours as needed. 21 tablet Mickie Bail, NP   HYDROcodone-acetaminophen (NORCO/VICODIN) 5-325 MG tablet Take 2 tablets by mouth every 4 (four) hours as needed. 6 tablet Mickie Bail, NP     I have reviewed the PDMP during this encounter.   Mickie Bail, NP 03/15/19 1719

## 2019-04-01 ENCOUNTER — Other Ambulatory Visit: Payer: Self-pay

## 2019-04-01 DIAGNOSIS — Z20822 Contact with and (suspected) exposure to covid-19: Secondary | ICD-10-CM

## 2019-04-04 LAB — NOVEL CORONAVIRUS, NAA: SARS-CoV-2, NAA: NOT DETECTED

## 2019-04-21 ENCOUNTER — Emergency Department (HOSPITAL_COMMUNITY): Payer: Self-pay

## 2019-04-21 ENCOUNTER — Encounter (HOSPITAL_COMMUNITY): Payer: Self-pay | Admitting: Emergency Medicine

## 2019-04-21 ENCOUNTER — Other Ambulatory Visit: Payer: Self-pay

## 2019-04-21 ENCOUNTER — Emergency Department (HOSPITAL_COMMUNITY)
Admission: EM | Admit: 2019-04-21 | Discharge: 2019-04-21 | Disposition: A | Discharge status: Home / Self Care | Payer: Self-pay | Attending: Emergency Medicine | Admitting: Emergency Medicine

## 2019-04-21 DIAGNOSIS — W010XXA Fall on same level from slipping, tripping and stumbling without subsequent striking against object, initial encounter: Secondary | ICD-10-CM | POA: Insufficient documentation

## 2019-04-21 DIAGNOSIS — Y939 Activity, unspecified: Secondary | ICD-10-CM | POA: Insufficient documentation

## 2019-04-21 DIAGNOSIS — W19XXXA Unspecified fall, initial encounter: Secondary | ICD-10-CM

## 2019-04-21 DIAGNOSIS — Y92009 Unspecified place in unspecified non-institutional (private) residence as the place of occurrence of the external cause: Secondary | ICD-10-CM | POA: Insufficient documentation

## 2019-04-21 DIAGNOSIS — F17210 Nicotine dependence, cigarettes, uncomplicated: Secondary | ICD-10-CM | POA: Insufficient documentation

## 2019-04-21 DIAGNOSIS — Y999 Unspecified external cause status: Secondary | ICD-10-CM | POA: Insufficient documentation

## 2019-04-21 DIAGNOSIS — S29019A Strain of muscle and tendon of unspecified wall of thorax, initial encounter: Secondary | ICD-10-CM

## 2019-04-21 DIAGNOSIS — Z79899 Other long term (current) drug therapy: Secondary | ICD-10-CM | POA: Insufficient documentation

## 2019-04-21 DIAGNOSIS — S161XXA Strain of muscle, fascia and tendon at neck level, initial encounter: Secondary | ICD-10-CM | POA: Insufficient documentation

## 2019-04-21 DIAGNOSIS — S39012A Strain of muscle, fascia and tendon of lower back, initial encounter: Secondary | ICD-10-CM | POA: Insufficient documentation

## 2019-04-21 DIAGNOSIS — S29012A Strain of muscle and tendon of back wall of thorax, initial encounter: Secondary | ICD-10-CM | POA: Insufficient documentation

## 2019-04-21 LAB — CBC WITH DIFFERENTIAL/PLATELET
Abs Immature Granulocytes: 0.01 10*3/uL (ref 0.00–0.07)
Basophils Absolute: 0 10*3/uL (ref 0.0–0.1)
Basophils Relative: 1 %
Eosinophils Absolute: 0.1 10*3/uL (ref 0.0–0.5)
Eosinophils Relative: 2 %
HCT: 44.4 % (ref 36.0–46.0)
Hemoglobin: 14.8 g/dL (ref 12.0–15.0)
Immature Granulocytes: 0 %
Lymphocytes Relative: 45 %
Lymphs Abs: 1.8 10*3/uL (ref 0.7–4.0)
MCH: 32 pg (ref 26.0–34.0)
MCHC: 33.3 g/dL (ref 30.0–36.0)
MCV: 96.1 fL (ref 80.0–100.0)
Monocytes Absolute: 0.5 10*3/uL (ref 0.1–1.0)
Monocytes Relative: 11 %
Neutro Abs: 1.6 10*3/uL — ABNORMAL LOW (ref 1.7–7.7)
Neutrophils Relative %: 41 %
Platelets: 205 10*3/uL (ref 150–400)
RBC: 4.62 MIL/uL (ref 3.87–5.11)
RDW: 12.5 % (ref 11.5–15.5)
WBC: 4 10*3/uL (ref 4.0–10.5)
nRBC: 0 % (ref 0.0–0.2)

## 2019-04-21 LAB — BASIC METABOLIC PANEL
Anion gap: 11 (ref 5–15)
BUN: 12 mg/dL (ref 6–20)
CO2: 22 mmol/L (ref 22–32)
Calcium: 9.3 mg/dL (ref 8.9–10.3)
Chloride: 106 mmol/L (ref 98–111)
Creatinine, Ser: 0.88 mg/dL (ref 0.44–1.00)
GFR calc Af Amer: >60 mL/min (ref >60)
GFR calc non Af Amer: >60 mL/min (ref >60)
Glucose, Bld: 78 mg/dL (ref 70–99)
Potassium: 3.7 mmol/L (ref 3.5–5.1)
Sodium: 139 mmol/L (ref 135–145)

## 2019-04-21 LAB — I-STAT BETA HCG BLOOD, ED (MC, WL, AP ONLY): I-stat hCG, quantitative: <5 m[IU]/mL (ref <5)

## 2019-04-21 MED ORDER — HYDROCODONE-ACETAMINOPHEN 5-325 MG PO TABS: 1.0000 | ORAL_TABLET | Freq: Four times a day (QID) | ORAL | 0 refills | Status: DC | PRN

## 2019-04-21 MED ORDER — MORPHINE SULFATE (PF) 4 MG/ML IV SOLN
4.0000 mg | Freq: Once | INTRAVENOUS | Status: AC
  Administered 2019-04-21: 4 mg via INTRAVENOUS
  Filled 2019-04-21: qty 1

## 2019-04-21 NOTE — ED Notes (Signed)
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

## 2019-04-21 NOTE — Discharge Instructions (Signed)
Rest.  Take hydrocodone as prescribed as needed for pain.  Follow-up with your primary doctor if symptoms or not improving in the next 3 to 4 days.

## 2019-04-21 NOTE — ED Provider Notes (Signed)
[facility] EMERGENCY DEPARTMENT Provider Note   CSN: [id number] Arrival date & time: 04/21/19  5366     History   Chief Complaint Chief Complaint  Patient presents with  . Fall  . Neck Pain    HPI Diana Oconnell is a 48 y.o. female.     Patient is a 48 year old female with history of anemia, anxiety, and depression.  She presents today for evaluation of a fall.  Patient states that she slipped over her a rug in the threshold of her doorway, causing her to fall backward and landed on her back.  She complains of pain in her neck and mid back.  She denies any numbness or tingling.  She denies any headache or loss of consciousness.  Patient transported here by EMS and was placed in a cervical collar.  The history is provided by the patient.  Fall This is a new problem. The current episode started less than 1 hour ago. The problem occurs constantly. The problem has not changed since onset.Nothing relieves the symptoms.    Past Medical History:  Diagnosis Date  . Anemia   . Anxiety   . Depression   . Infection    UTI  . Pancreatic cyst     There are no active problems to display for this patient.   Past Surgical History:  Procedure Laterality Date  . DILATION AND CURETTAGE OF UTERUS    . THERAPEUTIC ABORTION     multiple  . TUBAL LIGATION       OB History    Gravida  12   Para  5   Term  5   Preterm      AB  6   Living  5     SAB  3   TAB  3   Ectopic      Multiple      Live Births  5            Home Medications    Prior to Admission medications   Medication Sig Start Date End Date Taking? Authorizing Provider  acetaminophen (TYLENOL) 500 MG tablet Take 500 mg by mouth every 6 (six) hours as needed.    [provider]  azithromycin (ZITHROMAX) 250 MG tablet Start with 2 tablets today, then 1 daily thereafter. 02/01/19   Jaynee Eagles, PA-C  butalbital-acetaminophen-caffeine (FIORICET) [phone] MG tablet  Take 1-2 tablets by mouth every 6 (six) hours as needed for headache. 11/12/18 11/12/19  Raylene Everts, MD  cetirizine (ZYRTEC) 10 MG tablet Take 1 tablet (10 mg total) by mouth daily. 02/01/19   Jaynee Eagles, PA-C  clindamycin (CLEOCIN) 300 MG capsule Take 1 capsule (300 mg total) by mouth 3 (three) times daily. 03/15/19   Sharion Balloon, NP  fluticasone (FLONASE) 50 MCG/ACT nasal spray Place 2 sprays into both nostrils daily. 10/01/18   Raylene Everts, MD  HYDROcodone-acetaminophen (NORCO/VICODIN) 5-325 MG tablet Take 2 tablets by mouth every 4 (four) hours as needed. 03/15/19   Sharion Balloon, NP  hydrOXYzine (ATARAX/VISTARIL) 25 MG tablet Take 1-2 tablets (25-50 mg total) by mouth every 4 (four) hours as needed. 10/29/18   Raylene Everts, MD  ibuprofen (ADVIL) 800 MG tablet Take 1 tablet (800 mg total) by mouth every 8 (eight) hours as needed. 03/15/19   Sharion Balloon, NP  loratadine-pseudoephedrine (CLARITIN-D 12 HOUR) 5-120 MG tablet Take 1 tablet by mouth 2 (two) times daily. 10/01/18   Raylene Everts, MD  naproxen (NAPROSYN) 500 MG tablet Take 1 tablet (500 mg total) by mouth 2 (two) times daily. 02/01/19   Wallis Bamberg, PA-C  ondansetron (ZOFRAN) 4 MG tablet Take 1 tablet (4 mg total) by mouth every 6 (six) hours. 11/12/18   Eustace Moore, MD  pseudoephedrine (SUDAFED) 60 MG tablet Take 1 tablet (60 mg total) by mouth every 8 (eight) hours as needed for congestion. 02/01/19   Wallis Bamberg, PA-C  triamcinolone cream (KENALOG) 0.1 % Apply 1 application topically 2 (two) times daily. 09/19/18   Wieters, Junius Creamer, PA-C    Family History Family History  Problem Relation Age of Onset  . Healthy Mother   . Hypertension Father     Social History Social History   Tobacco Use  . Smoking status: Current Every Day Smoker    Packs/day: 0.25    Years: 12.00    Pack years: 3.00    Types: Cigarettes  . Smokeless tobacco: Never Used  Substance Use Topics  . Alcohol use: Yes    Comment:  occasional  . Drug use: Yes    Types: Marijuana    Comment: Smokes marijuana "all day every day"     Allergies   Patient has no known allergies.   Review of Systems Review of Systems  All other systems reviewed and are negative.    Physical Exam Updated Vital Signs BP 111/75   Pulse 62   Temp 98.4 F (36.9 C) (Oral)   Resp 17   Ht 5\' 7"  (1.702 m)   Wt 72.6 kg   SpO2 100%   BMI 25.07 kg/m   Physical Exam Vitals signs and nursing note reviewed.  Constitutional:      General: She is not in acute distress.    Appearance: She is well-developed. She is not diaphoretic.  HENT:     Head: Normocephalic and atraumatic.  Neck:     Musculoskeletal: Normal range of motion and neck supple.     Comments: Patient's neck immobilized in cervical collar.  There is tenderness in the soft tissues of the cervical region, but no bony tenderness or step-off. Cardiovascular:     Rate and Rhythm: Normal rate and regular rhythm.     Heart sounds: No murmur. No friction rub. No gallop.   Pulmonary:     Effort: Pulmonary effort is normal. No respiratory distress.     Breath sounds: Normal breath sounds. No wheezing.  Abdominal:     General: Bowel sounds are normal. There is no distension.     Palpations: Abdomen is soft.     Tenderness: There is no abdominal tenderness.  Musculoskeletal: Normal range of motion.     Comments: There is tenderness to palpation in the lower thoracic/upper lumbar region.  There is no step-off.  Skin:    General: Skin is warm and dry.  Neurological:     Mental Status: She is alert and oriented to person, place, and time.      ED Treatments / Results  Labs (all labs ordered are listed, but only abnormal results are displayed) Labs Reviewed  BASIC METABOLIC PANEL  CBC WITH DIFFERENTIAL/PLATELET  I-STAT BETA HCG BLOOD, ED ([facility], [facility], [facility] ONLY)    EKG None  Radiology No results found.  Procedures Procedures (including critical care time)   Medications Ordered in ED Medications  morphine 4 MG/ML injection 4 mg (has no administration in time range)     Initial Impression / Assessment and Plan / ED Course  I have  reviewed the triage vital signs and the nursing notes.  Pertinent labs & imaging results that were available during my care of the patient were reviewed by me and considered in my medical decision making (see chart for details).  Patient brought here by EMS after a slip and fall that occurred at home.  Patient slipped on a loose rug then landed on the threshold of her doorway.  She is complaining of severe pain in her neck and entire spine.  She was transported here by EMS with her neck immobilized in a cervical collar.  Patient assessed shortly after arrival.  She is stable from a hemodynamic standpoint.  Motor and sensation are intact throughout all extremities.  Patient then rolled in a log roll fashion while holding cervical immobilization.  She describes tenderness throughout her spine, mostly in the upper lumbar, lower thoracic, and entire cervical spines.  CT scan was obtained of these areas, all of which were unremarkable.  There is no evidence for fracture or other obvious issue.  At this point, I feel as the patient is appropriate for discharge.  She will be prescribed pain medication and is advised to follow-up as needed if not improving.  Final Clinical Impressions(s) / ED Diagnoses   Final diagnoses:  None    ED Discharge Orders    None       Geoffery Lyonselo, Asheley Hellberg, MD 04/21/19 1213

## 2019-04-21 NOTE — ED Triage Notes (Signed)
Pt in via [facility] after slip fall over threshold in her doorway this am. Pt denies LOC, states she fell backward, hit head. C/o posterior neck and mid-back pain. MAE's equally, no obv deformity, c-collar present.

## 2019-04-21 NOTE — ED Notes (Signed)
IV IS TAKEN OUT

## 2019-05-17 ENCOUNTER — Telehealth: Payer: Self-pay

## 2019-05-18 ENCOUNTER — Ambulatory Visit (HOSPITAL_COMMUNITY)
Admission: EM | Admit: 2019-05-18 | Discharge: 2019-05-18 | Disposition: A | Discharge status: Home / Self Care | Payer: Self-pay | Attending: Emergency Medicine | Admitting: Emergency Medicine

## 2019-05-18 ENCOUNTER — Encounter (HOSPITAL_COMMUNITY): Payer: Self-pay

## 2019-05-18 ENCOUNTER — Other Ambulatory Visit: Payer: Self-pay

## 2019-05-18 DIAGNOSIS — L02212 Cutaneous abscess of back [any part, except buttock]: Secondary | ICD-10-CM

## 2019-05-18 MED ORDER — LIDOCAINE HCL 2 % IJ SOLN
INTRAMUSCULAR | Status: AC
  Filled 2019-05-18: qty 20

## 2019-05-18 MED ORDER — DOXYCYCLINE HYCLATE 100 MG PO CAPS: 100.0000 mg | ORAL_CAPSULE | Freq: Two times a day (BID) | ORAL | 0 refills | Status: AC

## 2019-05-18 MED ORDER — IBUPROFEN 800 MG PO TABS: 800.0000 mg | ORAL_TABLET | Freq: Three times a day (TID) | ORAL | 0 refills | Status: DC

## 2019-05-18 MED ORDER — HYDROCODONE-ACETAMINOPHEN 5-325 MG PO TABS: 1.0000 | ORAL_TABLET | Freq: Four times a day (QID) | ORAL | 0 refills | Status: DC | PRN

## 2019-05-18 NOTE — Discharge Instructions (Signed)
Please begin doxycycline for 10 days  Apply warm compresses/hot rags to area with massage to express further drainage especially the first 24-48 hours  Ibuprofen for mild- moderate pain Hydrocodone for severe pain, use sparingly, do not drive or work after taking  Return if symptoms returning or not improving

## 2019-05-18 NOTE — ED Provider Notes (Signed)
[facility]    CSN: [id number] Arrival date & time: 05/18/19  0805      History   Chief Complaint Chief Complaint  Patient presents with  . Abscess    HPI Diana Oconnell is a 48 y.o. female.   Diana Oconnell is presenting today with an approximately 4 cm x 3 cm lesion on the posterior aspect of her left shoulder that Diana Oconnell describes as tender and warm to touch. Diana Oconnell has had spots like this before that Diana Oconnell has drained herself but was unable to drain this lesion. Diana Oconnell endorses some pain going into her arm when Diana Oconnell lays on the spot, but denies limited range of motion.  Diana Oconnell denies fever, chills, nausea or vomiting.  Believes this was triggered by using a specific Neutrogena/hygiene product.     Past Medical History:  Diagnosis Date  . Anemia   . Anxiety   . Depression   . Infection    UTI  . Pancreatic cyst     There are no problems to display for this patient.   Past Surgical History:  Procedure Laterality Date  . DILATION AND CURETTAGE OF UTERUS    . THERAPEUTIC ABORTION     multiple  . TUBAL LIGATION      OB History    Gravida  12   Para  5   Term  5   Preterm      AB  6   Living  5     SAB  3   TAB  3   Ectopic      Multiple      Live Births  5            Home Medications    Prior to Admission medications   Medication Sig Start Date End Date Taking? Authorizing Provider  acetaminophen (TYLENOL) 500 MG tablet Take 500 mg by mouth every 6 (six) hours as needed.    [provider]  butalbital-acetaminophen-caffeine (FIORICET) [phone] MG tablet Take 1-2 tablets by mouth every 6 (six) hours as needed for headache. 11/12/18 11/12/19  Raylene Everts, MD  cetirizine (ZYRTEC) 10 MG tablet Take 1 tablet (10 mg total) by mouth daily. 02/01/19   Jaynee Eagles, PA-C  clindamycin (CLEOCIN) 300 MG capsule Take 1 capsule (300 mg total) by mouth 3 (three) times daily. 03/15/19   Sharion Balloon, NP  doxycycline (VIBRAMYCIN) 100 MG capsule  Take 1 capsule (100 mg total) by mouth 2 (two) times daily for 10 days. 05/18/19 05/28/19  Ara Mano C, PA-C  fluticasone (FLONASE) 50 MCG/ACT nasal spray Place 2 sprays into both nostrils daily. 10/01/18   Raylene Everts, MD  HYDROcodone-acetaminophen (NORCO/VICODIN) 5-325 MG tablet Take 1-2 tablets by mouth every 6 (six) hours as needed for severe pain. 05/18/19   Pape Parson C, PA-C  hydrOXYzine (ATARAX/VISTARIL) 25 MG tablet Take 1-2 tablets (25-50 mg total) by mouth every 4 (four) hours as needed. 10/29/18   Raylene Everts, MD  ibuprofen (ADVIL) 800 MG tablet Take 1 tablet (800 mg total) by mouth 3 (three) times daily. 05/18/19   Jr Milliron C, PA-C  loratadine-pseudoephedrine (CLARITIN-D 12 HOUR) 5-120 MG tablet Take 1 tablet by mouth 2 (two) times daily. 10/01/18   Raylene Everts, MD  naproxen (NAPROSYN) 500 MG tablet Take 1 tablet (500 mg total) by mouth 2 (two) times daily. 02/01/19   Jaynee Eagles, PA-C  ondansetron (ZOFRAN) 4 MG tablet Take 1 tablet (4 mg total) by mouth every 6 (  six) hours. 11/12/18   Eustace MooreNelson, Yvonne Sue, MD  pseudoephedrine (SUDAFED) 60 MG tablet Take 1 tablet (60 mg total) by mouth every 8 (eight) hours as needed for congestion. 02/01/19   Wallis BambergMani, Mario, PA-C  triamcinolone cream (KENALOG) 0.1 % Apply 1 application topically 2 (two) times daily. 09/19/18   Thaer Miyoshi, Junius CreamerHallie C, PA-C    Family History Family History  Problem Relation Age of Onset  . Healthy Mother   . Hypertension Father     Social History Social History   Tobacco Use  . Smoking status: Current Every Day Smoker    Packs/day: 0.25    Years: 12.00    Pack years: 3.00    Types: Cigarettes  . Smokeless tobacco: Never Used  Substance Use Topics  . Alcohol use: Yes    Comment: occasional  . Drug use: Yes    Types: Marijuana    Comment: Smokes marijuana "all day every day"     Allergies   Patient has no known allergies.   Review of Systems Review of Systems  Constitutional:  Negative for chills, fatigue and fever.  Eyes: Negative for visual disturbance.  Respiratory: Negative for shortness of breath.   Cardiovascular: Negative for chest pain.  Gastrointestinal: Negative for abdominal pain, nausea and vomiting.  Musculoskeletal: Negative for arthralgias and joint swelling.  Skin: Positive for color change. Negative for rash and wound.          Neurological: Negative for dizziness, weakness, light-headedness and headaches.     Physical Exam Triage Vital Signs ED Triage Vitals  Enc Vitals Group     BP 05/18/19 0826 107/80     Pulse Rate 05/18/19 0826 (!) 105     Resp 05/18/19 0826 16     Temp 05/18/19 0826 98.8 F (37.1 C)     Temp Source 05/18/19 0826 Oral     SpO2 05/18/19 0826 99 %     Weight --      Height --      Head Circumference --      Peak Flow --      Pain Score 05/18/19 0825 10     Pain Loc --      Pain Edu? --      Excl. in GC? --    No data found.  Updated Vital Signs BP 107/80 (BP Location: Left Arm)   Pulse (!) 105   Temp 98.8 F (37.1 C) (Oral)   Resp 16   SpO2 99%   Visual Acuity Right Eye Distance:   Left Eye Distance:   Bilateral Distance:    Right Eye Near:   Left Eye Near:    Bilateral Near:     Physical Exam Vitals and nursing note reviewed.  Constitutional:      Appearance: Diana Oconnell is well-developed.     Comments: No acute distress  HENT:     Head: Normocephalic and atraumatic.     Nose: Nose normal.  Eyes:     Conjunctiva/sclera: Conjunctivae normal.  Cardiovascular:     Rate and Rhythm: Normal rate.  Pulmonary:     Effort: Pulmonary effort is normal. No respiratory distress.  Abdominal:     General: There is no distension.  Musculoskeletal:        General: Normal range of motion.     Cervical back: Neck supple.     Comments: Left shoulder: Full active range of motion of shoulder  Skin:    General: Skin is warm and dry.  Comments: 30 cm x 4 cm area of fluctuance and mild induration to left  posterior shoulder/back, slightly raised, tender to touch in this area, there is hyperpigmented, minimal erythema  Neurological:     Mental Status: Diana Oconnell is alert and oriented to person, place, and time.      UC Treatments / Results  Labs (all labs ordered are listed, but only abnormal results are displayed) Labs Reviewed - No data to display  EKG   Radiology No results found.  Procedures Incision and Drainage  Date/Time: 05/19/2019 8:20 AM Performed by: Jaliya Siegmann, [place] C, PA-C Authorized by: Mickael Mcnutt, Junius Creamer, PA-C   Consent:    Consent obtained:  Verbal   Consent given by:  Patient   Risks discussed:  Incomplete drainage, infection and pain   Alternatives discussed:  No treatment Location:    Type:  Cyst   Size:  3   Location:  Upper extremity   Upper extremity location:  Shoulder   Shoulder location:  L shoulder Pre-procedure details:    Skin preparation:  Betadine Anesthesia (see MAR for exact dosages):    Anesthesia method:  Local infiltration   Local anesthetic:  Lidocaine 2% w/o epi Procedure type:    Complexity:  Simple Procedure details:    Needle aspiration: no     Incision types:  Single straight   Incision depth:  Subcutaneous   Scalpel blade:  11   Wound management:  Probed and deloculated   Drainage:  Bloody and purulent   Drainage amount:  Moderate   Wound treatment:  Wound left open   Packing materials:  None Post-procedure details:    Patient tolerance of procedure:  Tolerated well, no immediate complications   (including critical care time)  Medications Ordered in UC Medications - No data to display  Initial Impression / Assessment and Plan / UC Course  I have reviewed the triage vital signs and the nursing notes.  Pertinent labs & imaging results that were available during my care of the patient were reviewed by me and considered in my medical decision making (see chart for details).     I&D performed, purulent material thick likely  epidermal cyst with secondary infection.  Will treat with doxycycline and have continue warm compresses with massage to express further drainage.  Advised patient if Diana Oconnell has recurrent issues with this to follow-up with dermatology as this may need removal.  Continue to monitor,Discussed strict return precautions. Patient verbalized understanding and is agreeable with plan.  Final Clinical Impressions(s) / UC Diagnoses   Final diagnoses:  Abscess of back     Discharge Instructions     Please begin doxycycline for 10 days  Apply warm compresses/hot rags to area with massage to express further drainage especially the first 24-48 hours  Ibuprofen for mild- moderate pain Hydrocodone for severe pain, use sparingly, do not drive or work after taking  Return if symptoms returning or not improving    ED Prescriptions    Medication Sig Dispense Auth. Provider   doxycycline (VIBRAMYCIN) 100 MG capsule Take 1 capsule (100 mg total) by mouth 2 (two) times daily for 10 days. 20 capsule Marijo Quizon C, PA-C   HYDROcodone-acetaminophen (NORCO/VICODIN) 5-325 MG tablet Take 1-2 tablets by mouth every 6 (six) hours as needed for severe pain. 8 tablet Montrelle Eddings C, PA-C   ibuprofen (ADVIL) 800 MG tablet Take 1 tablet (800 mg total) by mouth 3 (three) times daily. 21 tablet Daizee Firmin, [place] C, PA-C  I have reviewed the PDMP during this encounter.   Lew Dawes, [place] 05/19/19 [phone]

## 2019-05-18 NOTE — ED Triage Notes (Signed)
Patient presents to Urgent Care with complaints of abscess on her back on the left side since about 2 weeks ago. Patient reports she has tried to open it herself but she has not been able to because the "skin is too hard".

## 2019-05-19 DIAGNOSIS — L02212 Cutaneous abscess of back [any part, except buttock]: Secondary | ICD-10-CM

## 2019-07-11 ENCOUNTER — Encounter (HOSPITAL_COMMUNITY): Payer: Self-pay

## 2019-07-11 ENCOUNTER — Ambulatory Visit (HOSPITAL_COMMUNITY)
Admission: EM | Admit: 2019-07-11 | Discharge: 2019-07-11 | Disposition: A | Discharge status: Home / Self Care | Payer: Self-pay | Attending: Urgent Care | Admitting: Urgent Care

## 2019-07-11 ENCOUNTER — Other Ambulatory Visit: Payer: Self-pay

## 2019-07-11 DIAGNOSIS — R102 Pelvic and perineal pain: Secondary | ICD-10-CM | POA: Insufficient documentation

## 2019-07-11 DIAGNOSIS — R1031 Right lower quadrant pain: Secondary | ICD-10-CM | POA: Insufficient documentation

## 2019-07-11 LAB — POCT URINALYSIS DIP (DEVICE)
Bilirubin Urine: NEGATIVE
Glucose, UA: NEGATIVE mg/dL
Hgb urine dipstick: NEGATIVE
Ketones, ur: NEGATIVE mg/dL
Leukocytes,Ua: NEGATIVE
Nitrite: NEGATIVE
Protein, ur: NEGATIVE mg/dL
Specific Gravity, Urine: >=1.03 (ref 1.005–1.030)
Urobilinogen, UA: 0.2 mg/dL (ref 0.0–1.0)
pH: 5.5 (ref 5.0–8.0)

## 2019-07-11 MED ORDER — IBUPROFEN 800 MG PO TABS
800.0000 mg | ORAL_TABLET | Freq: Once | ORAL | Status: AC
  Administered 2019-07-11: 10:00:00 800 mg via ORAL

## 2019-07-11 MED ORDER — IBUPROFEN 800 MG PO TABS
ORAL_TABLET | ORAL | Status: AC
  Filled 2019-07-11: qty 1

## 2019-07-11 NOTE — ED Provider Notes (Signed)
[facility]   MRN: [id number] DOB: 03-31-1971  Subjective:   Diana Oconnell is a 49 y.o. female presenting for 3-day history of acute onset persistent right lower quadrant/pelvic pain.  Symptoms are moderate to severe, intermittent.  Resolved temporarily with ibuprofen.  Last dose was yesterday, pain returned this morning.  She is sexually active, does not use condoms for protection.  She has 1 female partner but would like to make sure she does not have an STI.  Has a history of tubal ligation.  LMP was about 3 months ago.  No current facility-administered medications for this encounter.  Current Outpatient Medications:  .  acetaminophen (TYLENOL) 500 MG tablet, Take 500 mg by mouth every 6 (six) hours as needed., Disp: , Rfl:  .  butalbital-acetaminophen-caffeine (FIORICET) 50-325-40 MG tablet, Take 1-2 tablets by mouth every 6 (six) hours as needed for headache., Disp: 20 tablet, Rfl: 0 .  cetirizine (ZYRTEC) 10 MG tablet, Take 1 tablet (10 mg total) by mouth daily., Disp: 30 tablet, Rfl: 0 .  clindamycin (CLEOCIN) 300 MG capsule, Take 1 capsule (300 mg total) by mouth 3 (three) times daily., Disp: 21 capsule, Rfl: 0 .  fluticasone (FLONASE) 50 MCG/ACT nasal spray, Place 2 sprays into both nostrils daily., Disp: 16 g, Rfl: 0 .  HYDROcodone-acetaminophen (NORCO/VICODIN) 5-325 MG tablet, Take 1-2 tablets by mouth every 6 (six) hours as needed for severe pain., Disp: 8 tablet, Rfl: 0 .  hydrOXYzine (ATARAX/VISTARIL) 25 MG tablet, Take 1-2 tablets (25-50 mg total) by mouth every 4 (four) hours as needed., Disp: 50 tablet, Rfl: 0 .  ibuprofen (ADVIL) 800 MG tablet, Take 1 tablet (800 mg total) by mouth 3 (three) times daily., Disp: 21 tablet, Rfl: 0 .  loratadine-pseudoephedrine (CLARITIN-D 12 HOUR) 5-120 MG tablet, Take 1 tablet by mouth 2 (two) times daily., Disp: 30 tablet, Rfl: 0 .  naproxen (NAPROSYN) 500 MG tablet, Take 1 tablet (500 mg total) by mouth 2 (two) times daily.,  Disp: 30 tablet, Rfl: 0 .  ondansetron (ZOFRAN) 4 MG tablet, Take 1 tablet (4 mg total) by mouth every 6 (six) hours., Disp: 12 tablet, Rfl: 0 .  pseudoephedrine (SUDAFED) 60 MG tablet, Take 1 tablet (60 mg total) by mouth every 8 (eight) hours as needed for congestion., Disp: 60 tablet, Rfl: 0 .  triamcinolone cream (KENALOG) 0.1 %, Apply 1 application topically 2 (two) times daily., Disp: 30 g, Rfl: 0   No Known Allergies  Past Medical History:  Diagnosis Date  . Anemia   . Anxiety   . Depression   . Infection    UTI  . Pancreatic cyst      Past Surgical History:  Procedure Laterality Date  . DILATION AND CURETTAGE OF UTERUS    . THERAPEUTIC ABORTION     multiple  . TUBAL LIGATION      Family History  Problem Relation Age of Onset  . Healthy Mother   . Hypertension Father     Social History   Tobacco Use  . Smoking status: Current Every Day Smoker    Packs/day: 0.25    Years: 12.00    Pack years: 3.00    Types: Cigars  . Smokeless tobacco: Never Used  Substance Use Topics  . Alcohol use: Yes    Alcohol/week: 7.0 standard drinks    Types: 7 Glasses of wine per week    Comment: occasional  . Drug use: Yes    Types: Marijuana    Comment: Smokes marijuana "  all day every day"    Review of Systems  Constitutional: Negative for chills and fever.  Respiratory: Negative for shortness of breath.   Cardiovascular: Negative for chest pain.  Gastrointestinal: Positive for abdominal pain. Negative for blood in stool, constipation, diarrhea, nausea and vomiting.  Genitourinary: Negative for dysuria, flank pain, frequency, hematuria and urgency.  Musculoskeletal: Negative for myalgias.  Skin: Negative for rash.  Neurological: Negative for dizziness and headaches.     Objective:   Vitals: BP 104/71 (BP Location: Left Arm)   Pulse 66   Temp 98.1 F (36.7 C) (Oral)   Resp 17   SpO2 99%   Physical Exam Constitutional:      General: She is not in acute  distress.    Appearance: Normal appearance. She is well-developed and normal weight. She is not ill-appearing, toxic-appearing or diaphoretic.  HENT:     Head: Normocephalic and atraumatic.     Right Ear: External ear normal.     Left Ear: External ear normal.     Nose: Nose normal.     Mouth/Throat:     Mouth: Mucous membranes are moist.     Pharynx: Oropharynx is clear.  Eyes:     General: No scleral icterus.    Extraocular Movements: Extraocular movements intact.     Pupils: Pupils are equal, round, and reactive to light.  Cardiovascular:     Rate and Rhythm: Normal rate and regular rhythm.     Heart sounds: Normal heart sounds. No murmur. No friction rub. No gallop.   Pulmonary:     Effort: Pulmonary effort is normal. No respiratory distress.     Breath sounds: Normal breath sounds. No stridor. No wheezing, rhonchi or rales.  Abdominal:     General: Bowel sounds are normal. There is no distension.     Palpations: Abdomen is soft. There is no mass.     Tenderness: There is abdominal tenderness in the right lower quadrant and suprapubic area. There is no right CVA tenderness, left CVA tenderness, guarding or rebound.  Skin:    General: Skin is warm and dry.     Coloration: Skin is not pale.     Findings: No rash.  Neurological:     General: No focal deficit present.     Mental Status: She is alert and oriented to person, place, and time.  Psychiatric:        Mood and Affect: Mood normal.        Behavior: Behavior normal.        Thought Content: Thought content normal.        Judgment: Judgment normal.     Results for orders placed or performed during the hospital encounter of 07/11/19 (from the past 24 hour(s))  POCT urinalysis dip (device)     Status: None   Collection Time: 07/11/19  9:29 AM  Result Value Ref Range   Glucose, UA NEGATIVE NEGATIVE mg/dL   Bilirubin Urine NEGATIVE NEGATIVE   Ketones, ur NEGATIVE NEGATIVE mg/dL   Specific Gravity, Urine >=1.030 1.005 -  1.030   Hgb urine dipstick NEGATIVE NEGATIVE   pH 5.5 5.0 - 8.0   Protein, ur NEGATIVE NEGATIVE mg/dL   Urobilinogen, UA 0.2 0.0 - 1.0 mg/dL   Nitrite NEGATIVE NEGATIVE   Leukocytes,Ua NEGATIVE NEGATIVE    Assessment and Plan :   1. Right lower quadrant abdominal pain   2. Pelvic pain     Physical exam findings mild.  On further questioning, patient is  actually drinking 4-5 times the recommended amount of wine per day.  She had just started drinking this way in the past week.  Recommend that she cut back on her alcohol use.  Hydrate better with water.  Labs pending. Counseled patient on potential for adverse effects with medications prescribed/recommended today, ER and return-to-clinic precautions discussed, patient verbalized understanding.    Wallis Bamberg, [place] 07/11/19 [phone]

## 2019-07-11 NOTE — ED Triage Notes (Signed)
Patient presents to Urgent Care with complaints of right lower abdominal pain since three days ago. Patient reports she took some ibuprofen and it helped but then returned. Pt denies urinary sx.

## 2019-07-11 NOTE — Discharge Instructions (Addendum)
Try to drink 5 ounces of wine per day only. Drinking more than this can be very harmful.

## 2019-07-12 LAB — URINE CULTURE: Culture: NO GROWTH

## 2019-07-14 ENCOUNTER — Telehealth: Payer: Self-pay | Admitting: Emergency Medicine

## 2019-07-14 DIAGNOSIS — N760 Acute vaginitis: Secondary | ICD-10-CM

## 2019-07-14 DIAGNOSIS — B9689 Other specified bacterial agents as the cause of diseases classified elsewhere: Secondary | ICD-10-CM

## 2019-07-14 DIAGNOSIS — B3731 Acute candidiasis of vulva and vagina: Secondary | ICD-10-CM

## 2019-07-14 DIAGNOSIS — B373 Candidiasis of vulva and vagina: Secondary | ICD-10-CM

## 2019-07-14 LAB — CERVICOVAGINAL ANCILLARY ONLY
Bacterial vaginitis: POSITIVE — AB
Candida vaginitis: POSITIVE — AB
Chlamydia: NEGATIVE
Neisseria Gonorrhea: NEGATIVE
Trichomonas: NEGATIVE

## 2019-07-14 MED ORDER — FLUCONAZOLE 150 MG PO TABS: ORAL_TABLET | ORAL | 0 refills | Status: DC

## 2019-07-14 MED ORDER — METRONIDAZOLE 500 MG PO TABS: 500.0000 mg | ORAL_TABLET | Freq: Two times a day (BID) | ORAL | 0 refills | Status: DC

## 2019-07-14 NOTE — Progress Notes (Signed)
We are sorry that you are not feeling well. Here is how we plan to help!  I have reviewed your results.  You have Bacterial Vaginosis and Yeast Vaginitis.  Your STD tests were negative.  I've sent the following prescriptions to your pharmacy.  Please take as directed.  1. Metronidazole 500 mg, take twice daily for 1 week. 2. Diflucan 150 mg, take one tablet today and one tablet when you finish the antibiotics.  Vaginosis is an inflammation of the vagina that can result in discharge, itching and pain. The cause is usually a change in the normal balance of vaginal bacteria or an infection. Vaginosis can also result from reduced estrogen levels after menopause.  The most common causes of vaginosis are:   Bacterial vaginosis which results from an overgrowth of one on several organisms that are normally present in your vagina.   Yeast infections which are caused by a naturally occurring fungus called candida.   Vaginal atrophy (atrophic vaginosis) which results from the thinning of the vagina from reduced estrogen levels after menopause.   Trichomoniasis which is caused by a parasite and is commonly transmitted by sexual intercourse.  Factors that increase your risk of developing vaginosis include: Marland Kitchen Medications, such as antibiotics and steroids . Uncontrolled diabetes . Use of hygiene products such as bubble bath, vaginal spray or vaginal deodorant . Douching . Wearing damp or tight-fitting clothing . Using an intrauterine device (IUD) for birth control . Hormonal changes, such as those associated with pregnancy, birth control pills or menopause . Sexual activity . Having a sexually transmitted infection    Be sure to take all of the medication as directed. Stop taking any medication if you develop a rash, tongue swelling or shortness of breath. Mothers who are breast feeding should consider pumping and discarding their breast milk while on these antibiotics. However, there is no  consensus that infant exposure at these doses would be harmful.  Remember that medication creams can weaken latex condoms. Marland Kitchen   HOME CARE:  Good hygiene may prevent some types of vaginosis from recurring and may relieve some symptoms:  . Avoid baths, hot tubs and whirlpool spas. Rinse soap from your outer genital area after a shower, and dry the area well to prevent irritation. Don't use scented or harsh soaps, such as those with deodorant or antibacterial action. Marland Kitchen Avoid irritants. These include scented tampons and pads. . Wipe from front to back after using the toilet. Doing so avoids spreading fecal bacteria to your vagina.  Other things that may help prevent vaginosis include:  Marland Kitchen Don't douche. Your vagina doesn't require cleansing other than normal bathing. Repetitive douching disrupts the normal organisms that reside in the vagina and can actually increase your risk of vaginal infection. Douching won't clear up a vaginal infection. . Use a latex condom. Both female and female latex condoms may help you avoid infections spread by sexual contact. . Wear cotton underwear. Also wear pantyhose with a cotton crotch. If you feel comfortable without it, skip wearing underwear to bed. Yeast thrives in [employer] Your symptoms should improve in the next day or two.  GET HELP RIGHT AWAY IF:  . You have pain in your lower abdomen ( pelvic area or over your ovaries) . You develop nausea or vomiting . You develop a fever . Your discharge changes or worsens . You have persistent pain with intercourse . You develop shortness of breath, a rapid pulse, or you faint.  These symptoms could be  signs of problems or infections that need to be evaluated by a medical provider now.  MAKE SURE YOU    Understand these instructions.  Will watch your condition.  Will get help right away if you are not doing well or get worse.  Your e-visit answers were reviewed by a board certified advanced  clinical practitioner to complete your personal care plan. Depending upon the condition, your plan could have included both over the counter or prescription medications. Please review your pharmacy choice to make sure that you have choses a pharmacy that is open for you to pick up any needed prescription, Your safety is important to [place]. If you have drug allergies check your prescription carefully.   You can use MyChart to ask questions about today's visit, request a non-urgent call back, or ask for a work or school excuse for 24 hours related to this e-Visit. If it has been greater than 24 hours you will need to follow up with your provider, or enter a new e-Visit to address those concerns. You will get a MyChart message within the next two days asking about your experience. I hope that your e-visit has been valuable and will speed your recovery.  Approximately 5 minutes was used in reviewing the patient's chart, questionnaire, prescribing medications, and documentation.

## 2020-01-29 IMAGING — CT CT L SPINE W/O CM
3 series · 11 of 33 positions shown, 13 images · non-contrast
Comparison: CT scan of the abdomen dated 03/12/2015

CLINICAL DATA: Back pain secondary to a fall at home this morning.

EXAM:
CT LUMBAR SPINE WITHOUT CONTRAST
TECHNIQUE: Multidetector CT imaging of the lumbar spine was performed without
intravenous contrast administration. Multiplanar CT image
reconstructions were also generated.

[Series 5: l-spine 2.0 st · axial · 0.29mm/px · z∈[-579,-427]mm · 3 of 125 slices shown, 4 images]
[im 29/125  soft-tissue]
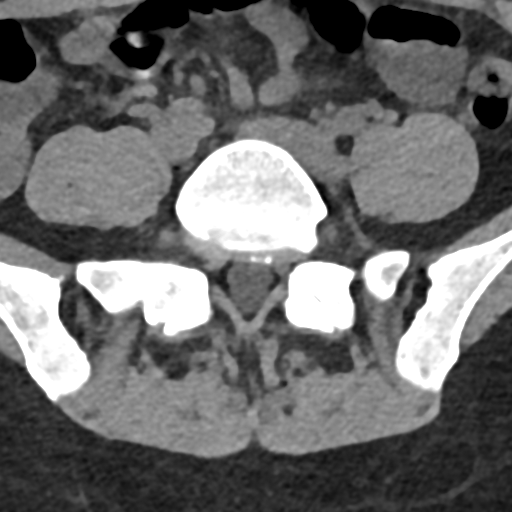
[im 29/125  bone]
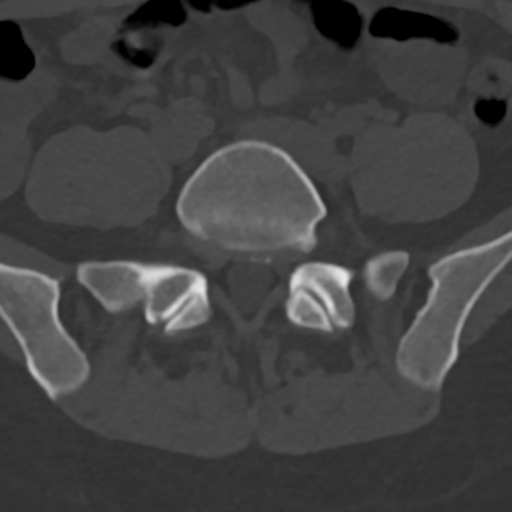
[im 67/125  bone]
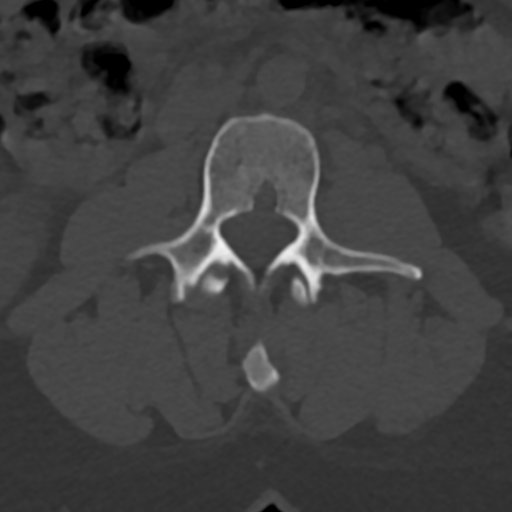
[im 105/125  bone]
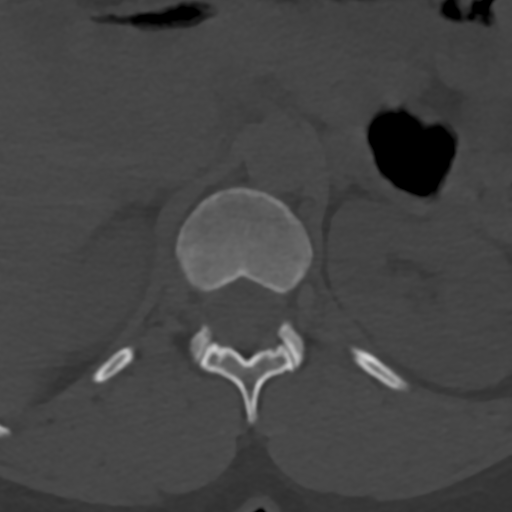

[Series 7: l-spine 2.0 cor bone · coronal · 0.29mm/px · 3 of 65 slices shown]
[im 13/65  bone]
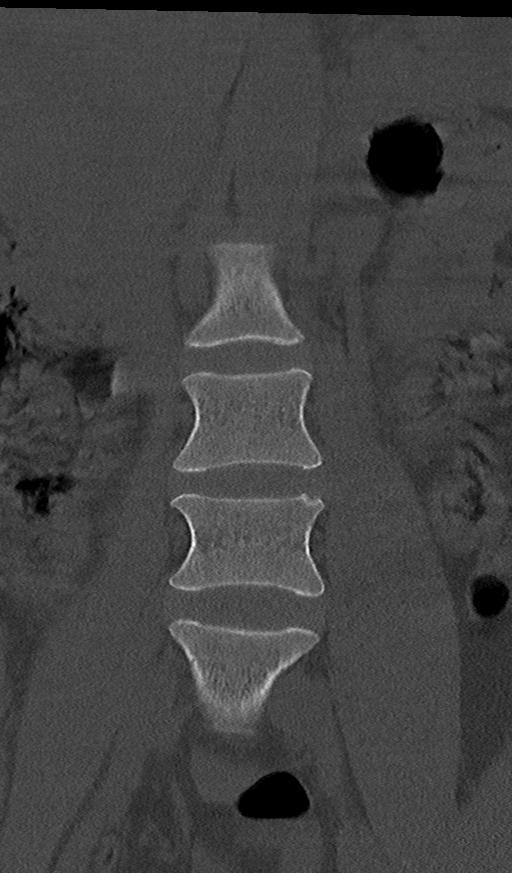
[im 26/65  bone]
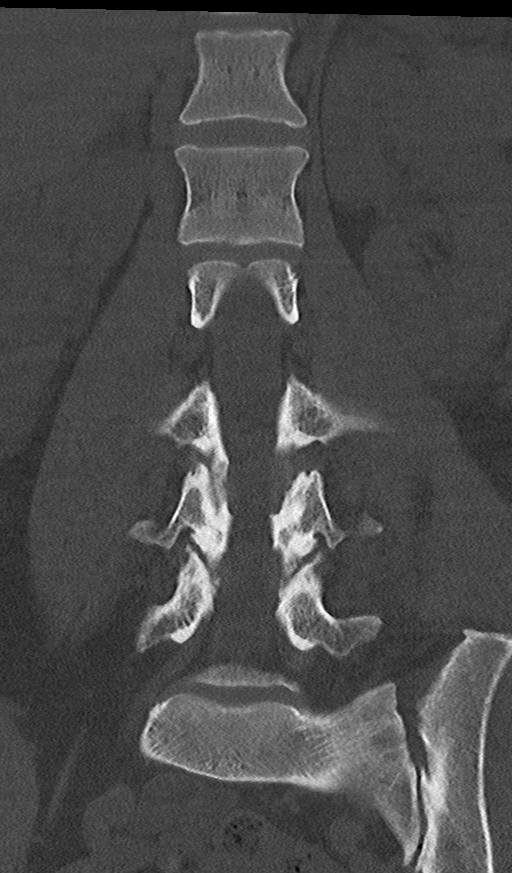
[im 39/65  bone]
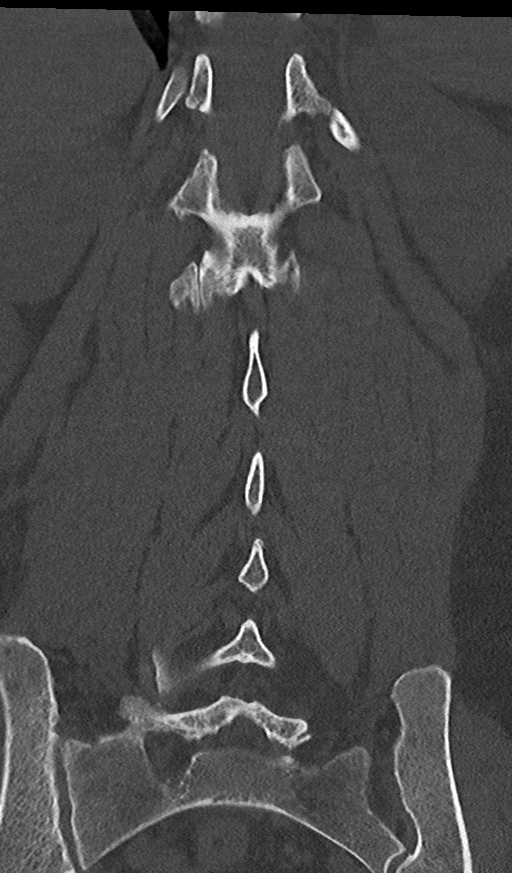

[Series 8: l-spine 2.0 sag bone · sagittal · 0.28mm/px · 5 of 61 slices shown, 6 images]
[im 21/61  bone]
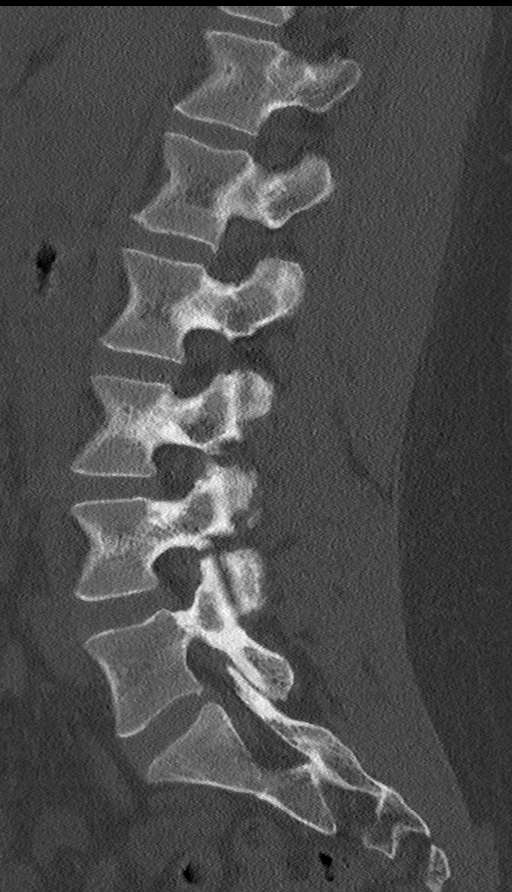
[im 26/61  bone]
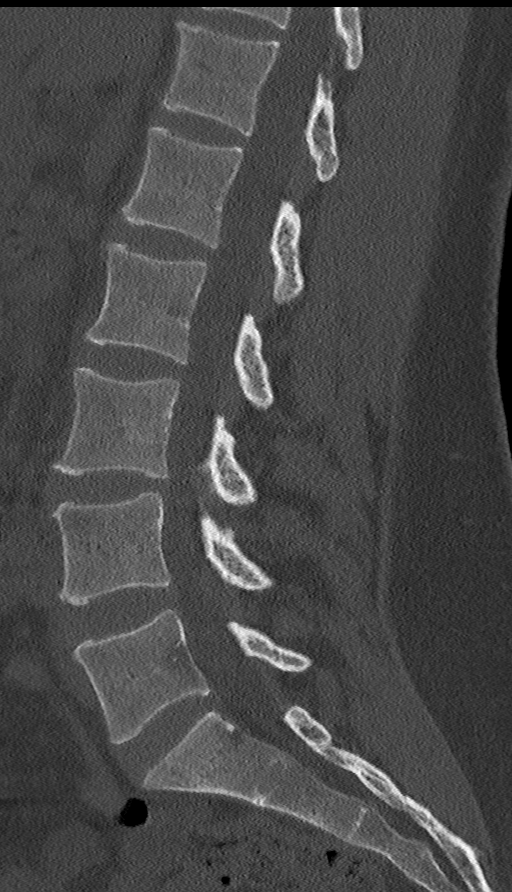
[im 31/61  soft-tissue]
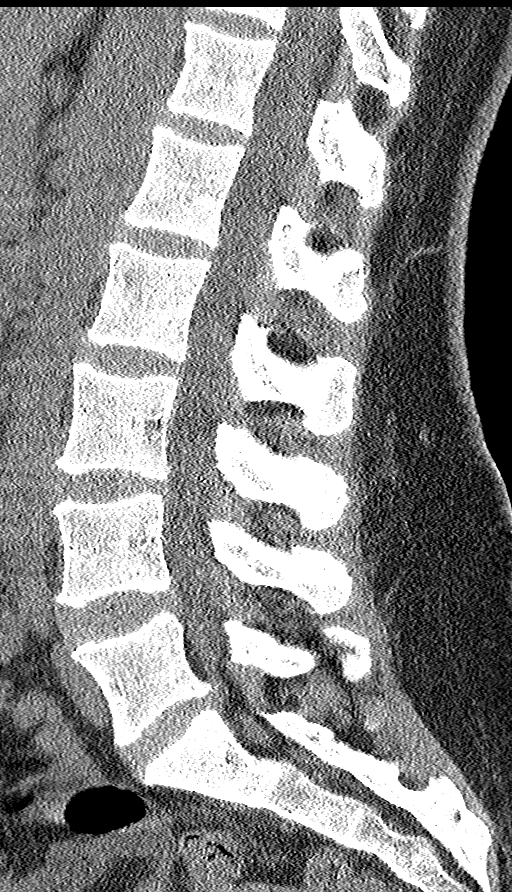
[im 31/61  bone]
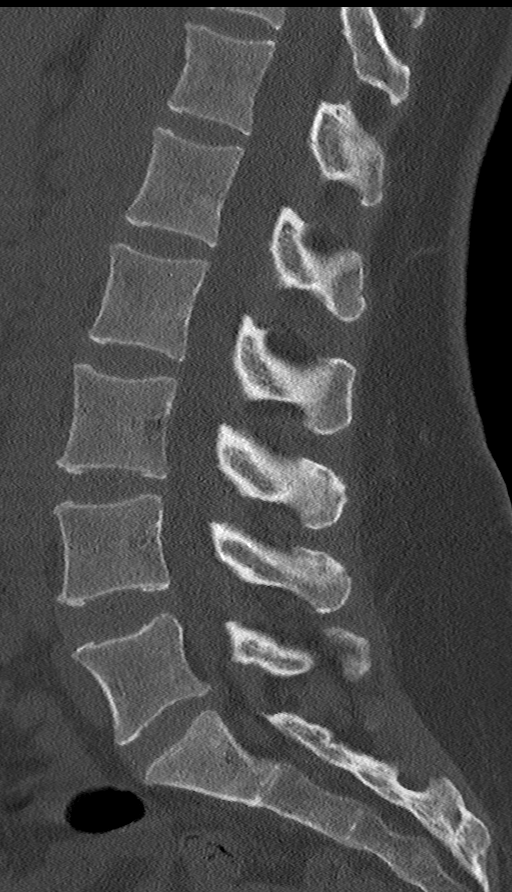
[im 36/61  bone]
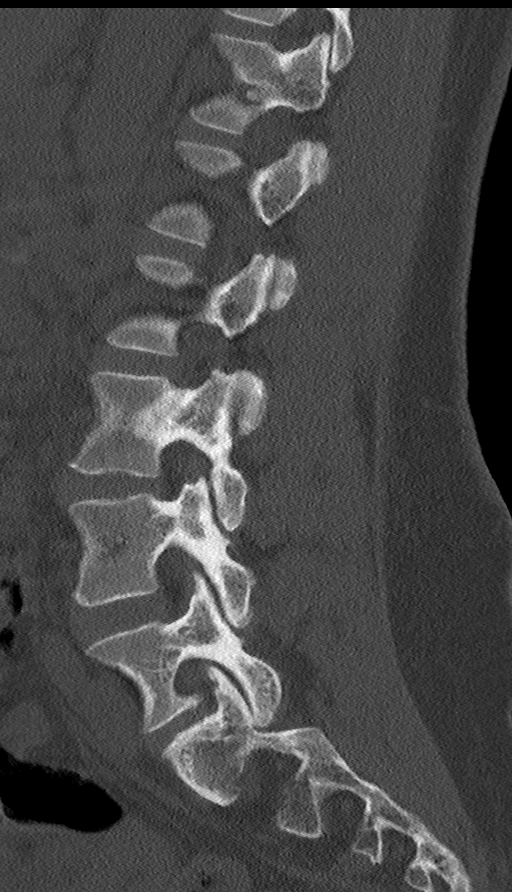
[im 41/61  bone]
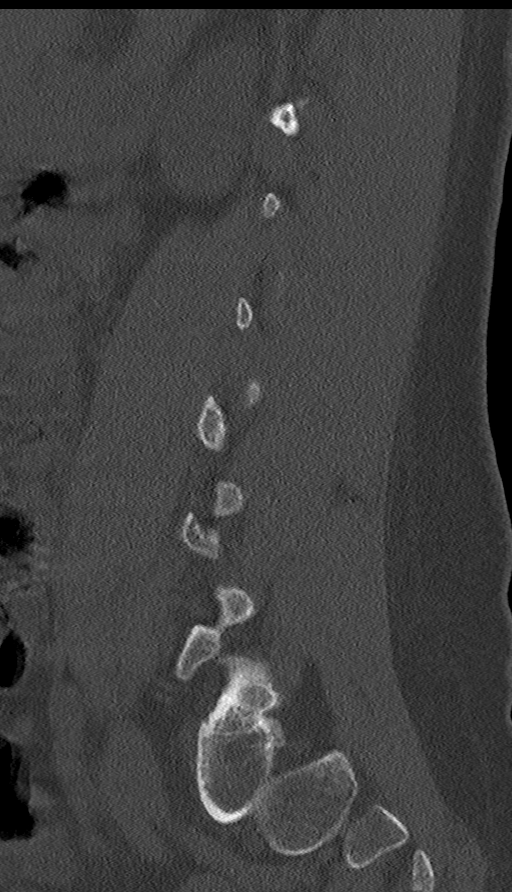

[11 of 33 positions shown; findings below may reference images not displayed]

FINDINGS: Segmentation: 5 lumbar type vertebrae.

Alignment: Normal.

Vertebrae: No acute fracture or focal pathologic process.

Paraspinal and other soft tissues: Negative.

Disc levels: T11-12 through L4-5: Normal discs. Minimal degenerative
changes of facet joints from L2-3 through L4-5. No foraminal or
spinal stenoses.

L5-S1: Small broad-based disc bulge slightly asymmetric to the left
without neural impingement. No foraminal or spinal stenosis. Minimal
degenerative changes of the facet joints.
IMPRESSION: 1. No significant abnormality of the lumbar spine.
2. Small broad-based disc bulge at L5-S1 slightly asymmetric to the
left without neural impingement.

## 2020-05-21 ENCOUNTER — Other Ambulatory Visit: Payer: Self-pay

## 2020-05-21 ENCOUNTER — Encounter (HOSPITAL_COMMUNITY): Payer: Self-pay

## 2020-05-21 ENCOUNTER — Ambulatory Visit (HOSPITAL_COMMUNITY)
Admission: EM | Admit: 2020-05-21 | Discharge: 2020-05-21 | Disposition: A | Discharge status: Home / Self Care | Payer: Self-pay | Attending: Urgent Care | Admitting: Urgent Care

## 2020-05-21 DIAGNOSIS — B9689 Other specified bacterial agents as the cause of diseases classified elsewhere: Secondary | ICD-10-CM | POA: Insufficient documentation

## 2020-05-21 DIAGNOSIS — N760 Acute vaginitis: Secondary | ICD-10-CM | POA: Insufficient documentation

## 2020-05-21 DIAGNOSIS — B349 Viral infection, unspecified: Secondary | ICD-10-CM

## 2020-05-21 DIAGNOSIS — R051 Acute cough: Secondary | ICD-10-CM | POA: Insufficient documentation

## 2020-05-21 DIAGNOSIS — F17290 Nicotine dependence, other tobacco product, uncomplicated: Secondary | ICD-10-CM | POA: Insufficient documentation

## 2020-05-21 DIAGNOSIS — Z79899 Other long term (current) drug therapy: Secondary | ICD-10-CM | POA: Insufficient documentation

## 2020-05-21 DIAGNOSIS — Z20822 Contact with and (suspected) exposure to covid-19: Secondary | ICD-10-CM | POA: Insufficient documentation

## 2020-05-21 DIAGNOSIS — Z791 Long term (current) use of non-steroidal anti-inflammatories (NSAID): Secondary | ICD-10-CM | POA: Insufficient documentation

## 2020-05-21 LAB — SARS CORONAVIRUS 2 (TAT 6-24 HRS): SARS Coronavirus 2: NEGATIVE

## 2020-05-21 MED ORDER — CETIRIZINE HCL 10 MG PO TABS: 10.0000 mg | ORAL_TABLET | Freq: Every day | ORAL | 0 refills | Status: DC

## 2020-05-21 MED ORDER — BENZONATATE 100 MG PO CAPS: 100.0000 mg | ORAL_CAPSULE | Freq: Three times a day (TID) | ORAL | 0 refills | Status: DC | PRN

## 2020-05-21 MED ORDER — PSEUDOEPHEDRINE HCL 60 MG PO TABS: 60.0000 mg | ORAL_TABLET | Freq: Three times a day (TID) | ORAL | 0 refills | Status: DC | PRN

## 2020-05-21 MED ORDER — PROMETHAZINE-DM 6.25-15 MG/5ML PO SYRP: 5.0000 mL | ORAL_SOLUTION | Freq: Every evening | ORAL | 0 refills | Status: DC | PRN

## 2020-05-21 MED ORDER — METRONIDAZOLE 500 MG PO TABS: 500.0000 mg | ORAL_TABLET | Freq: Two times a day (BID) | ORAL | 0 refills | Status: DC

## 2020-05-21 NOTE — ED Triage Notes (Addendum)
Pt in with c/o fever, cough, and bodyaches that have been going on for 2 days now.  Pt has been taking mucinex and ibuprofen with some relief  Pt also c/o yellow discharge, requesting STI testing

## 2020-05-21 NOTE — ED Provider Notes (Signed)
[facility]   MRN: [id number] DOB: 1971/01/29  Subjective:   D[facility] Oconnell is a 50 y.o. female presenting for 2-day history of acute onset fever, cough, body aches patient has also had some yellow discharge and slight pelvic discomfort.  She does have a history of BV.  Would like to make sure she does not have an STI.  Patient is not Covid vaccinated.  She is a non-smoker.  Denies history of respiratory disorders.  No current facility-administered medications for this encounter.  Current Outpatient Medications:  .  acetaminophen (TYLENOL) 500 MG tablet, Take 500 mg by mouth every 6 (six) hours as needed., Disp: , Rfl:  .  cetirizine (ZYRTEC) 10 MG tablet, Take 1 tablet (10 mg total) by mouth daily., Disp: 30 tablet, Rfl: 0 .  fluconazole (DIFLUCAN) 150 MG tablet, Take 1 tablet today and 1 tablet when you finish the antibiotics., Disp: 2 tablet, Rfl: 0 .  fluticasone (FLONASE) 50 MCG/ACT nasal spray, Place 2 sprays into both nostrils daily., Disp: 16 g, Rfl: 0 .  hydrOXYzine (ATARAX/VISTARIL) 25 MG tablet, Take 1-2 tablets (25-50 mg total) by mouth every 4 (four) hours as needed., Disp: 50 tablet, Rfl: 0 .  ibuprofen (ADVIL) 800 MG tablet, Take 1 tablet (800 mg total) by mouth 3 (three) times daily., Disp: 21 tablet, Rfl: 0 .  loratadine-pseudoephedrine (CLARITIN-D 12 HOUR) 5-120 MG tablet, Take 1 tablet by mouth 2 (two) times daily., Disp: 30 tablet, Rfl: 0 .  metroNIDAZOLE (FLAGYL) 500 MG tablet, Take 1 tablet (500 mg total) by mouth 2 (two) times daily., Disp: 14 tablet, Rfl: 0 .  naproxen (NAPROSYN) 500 MG tablet, Take 1 tablet (500 mg total) by mouth 2 (two) times daily., Disp: 30 tablet, Rfl: 0 .  pseudoephedrine (SUDAFED) 60 MG tablet, Take 1 tablet (60 mg total) by mouth every 8 (eight) hours as needed for congestion., Disp: 60 tablet, Rfl: 0 .  triamcinolone cream (KENALOG) 0.1 %, Apply 1 application topically 2 (two) times daily., Disp: 30 g, Rfl: 0   No  Known Allergies  Past Medical History:  Diagnosis Date  . Anemia   . Anxiety   . Depression   . Infection    UTI  . Pancreatic cyst      Past Surgical History:  Procedure Laterality Date  . DILATION AND CURETTAGE OF UTERUS    . THERAPEUTIC ABORTION     multiple  . TUBAL LIGATION      Family History  Problem Relation Age of Onset  . Healthy Mother   . Hypertension Father     Social History   Tobacco Use  . Smoking status: Current Every Day Smoker    Packs/day: 0.25    Years: 12.00    Pack years: 3.00    Types: Cigars  . Smokeless tobacco: Never Used  Vaping Use  . Vaping Use: Some days  Substance Use Topics  . Alcohol use: Yes    Alcohol/week: 7.0 standard drinks    Types: 7 Glasses of wine per week    Comment: occasional  . Drug use: Yes    Types: Marijuana    Comment: Smokes marijuana "all day every day"    ROS   Objective:   Vitals: BP 90/62   Pulse 84   Temp 98.3 F (36.8 C) (Oral)   Resp 18   SpO2 99%   Physical Exam Constitutional:      General: She is not in acute distress.  Appearance: Normal appearance. She is well-developed. She is not ill-appearing, toxic-appearing or diaphoretic.  HENT:     Head: Normocephalic and atraumatic.     Nose: Nose normal.     Mouth/Throat:     Mouth: Mucous membranes are moist.  Eyes:     Extraocular Movements: Extraocular movements intact.     Pupils: Pupils are equal, round, and reactive to light.  Cardiovascular:     Rate and Rhythm: Normal rate and regular rhythm.     Pulses: Normal pulses.     Heart sounds: Normal heart sounds. No murmur heard. No friction rub. No gallop.   Pulmonary:     Effort: Pulmonary effort is normal. No respiratory distress.     Breath sounds: Normal breath sounds. No stridor. No wheezing, rhonchi or rales.  Skin:    General: Skin is warm and dry.     Findings: No rash.  Neurological:     Mental Status: She is alert and oriented to person, place, and time.   Psychiatric:        Mood and Affect: Mood normal.        Behavior: Behavior normal.        Thought Content: Thought content normal.        Judgment: Judgment normal.       Assessment and Plan :   PDMP not reviewed this encounter.  1. Viral syndrome   2. Bacterial vaginosis     Will manage for viral illness such as viral URI, viral syndrome, viral rhinitis, COVID-19. Counseled patient on nature of COVID-19 including modes of transmission, diagnostic testing, management and supportive care.  Offered scripts for symptomatic relief. COVID 19 testing is pending.  Start Flagyl for recurrent BV, labs pending.  Counseled patient on potential for adverse effects with medications prescribed/recommended today, ER and return-to-clinic precautions discussed, patient verbalized understanding.     Wallis Bamberg, [place] 05/21/20 [phone]

## 2020-05-21 NOTE — Discharge Instructions (Signed)

## 2020-05-22 LAB — CERVICOVAGINAL ANCILLARY ONLY
Bacterial Vaginitis (gardnerella): POSITIVE — AB
Candida Glabrata: NEGATIVE
Candida Vaginitis: NEGATIVE
Chlamydia: NEGATIVE
Comment: NEGATIVE
Comment: NEGATIVE
Comment: NEGATIVE
Comment: NEGATIVE
Comment: NEGATIVE
Comment: NORMAL
Neisseria Gonorrhea: NEGATIVE
Trichomonas: NEGATIVE

## 2020-05-25 ENCOUNTER — Ambulatory Visit (HOSPITAL_COMMUNITY): Admission: EM | Admit: 2020-05-25 | Discharge: 2020-05-25 | Discharge status: Against Medical Advice | Payer: Self-pay

## 2020-05-25 ENCOUNTER — Other Ambulatory Visit: Payer: Self-pay

## 2020-05-25 ENCOUNTER — Telehealth (HOSPITAL_COMMUNITY): Payer: Self-pay

## 2020-05-25 ENCOUNTER — Ambulatory Visit (HOSPITAL_COMMUNITY)
Admission: EM | Admit: 2020-05-25 | Discharge: 2020-05-25 | Disposition: A | Discharge status: Home / Self Care | Payer: Self-pay | Attending: Family Medicine | Admitting: Family Medicine

## 2020-05-25 ENCOUNTER — Encounter (HOSPITAL_COMMUNITY): Payer: Self-pay

## 2020-05-25 DIAGNOSIS — M25552 Pain in left hip: Secondary | ICD-10-CM

## 2020-05-25 DIAGNOSIS — M25551 Pain in right hip: Secondary | ICD-10-CM

## 2020-05-25 MED ORDER — TIZANIDINE HCL 4 MG PO TABS: 4.0000 mg | ORAL_TABLET | Freq: Every day | ORAL | 0 refills | Status: DC

## 2020-05-25 MED ORDER — KETOROLAC TROMETHAMINE 60 MG/2ML IM SOLN
60.0000 mg | Freq: Once | INTRAMUSCULAR | Status: AC
  Administered 2020-05-25: 60 mg via INTRAMUSCULAR

## 2020-05-25 MED ORDER — KETOROLAC TROMETHAMINE 60 MG/2ML IM SOLN
INTRAMUSCULAR | Status: AC
  Filled 2020-05-25: qty 2

## 2020-05-25 MED ORDER — NAPROXEN 500 MG PO TABS: 500.0000 mg | ORAL_TABLET | Freq: Two times a day (BID) | ORAL | 0 refills | Status: DC

## 2020-05-25 NOTE — Discharge Instructions (Signed)
Take tizanidine 4 mg at bedtime as needed for hip pain. For pain you can take Naproxen 500 mg twice daily as needed for inflammation. You received a Toradol injection which is an anti-inflammatory / pain medication. You will not need to take any Naproxen until tomorrow, You may take Tizanidine at bedtime as needed for pain.
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
## 2020-05-25 NOTE — ED Triage Notes (Signed)
PT called 3x in front lobby no answer.
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
## 2020-05-25 NOTE — ED Triage Notes (Signed)
Unable to locate PT  Called 3 times.

## 2020-06-15 ENCOUNTER — Other Ambulatory Visit (INDEPENDENT_AMBULATORY_CARE_PROVIDER_SITE_OTHER): Payer: Self-pay | Admitting: Family Medicine

## 2020-06-17 ENCOUNTER — Telehealth: Payer: Self-pay | Admitting: Orthopedic Surgery

## 2020-06-17 DIAGNOSIS — M5440 Lumbago with sciatica, unspecified side: Secondary | ICD-10-CM

## 2020-06-17 MED ORDER — TIZANIDINE HCL 4 MG PO TABS: 4.0000 mg | ORAL_TABLET | Freq: Four times a day (QID) | ORAL | 0 refills | Status: DC | PRN

## 2020-06-17 NOTE — Progress Notes (Signed)
We are sorry that you are not feeling well.  Here is how we plan to help!  Based on what you have shared with me it looks like you mostly have acute back pain.  Acute back pain is defined as musculoskeletal pain that can resolve in 1-3 weeks with conservative treatment.  I have prescribed Zanaflex 4mg . Some patients experience stomach irritation or in increased heartburn with anti-inflammatory drugs.  Please keep in mind that muscle relaxer's can cause fatigue and should not be taken while at work or driving.  Back pain is very common.  The pain often gets better over time.  The cause of back pain is usually not dangerous.  Most people can learn to manage their back pain on their own.  Home Care  Stay active.  Start with short walks on flat ground if you can.  Try to walk farther each day.  Do not sit, drive or stand in one place for more than 30 minutes.  Do not stay in bed.  Do not avoid exercise or work.  Activity can help your back heal faster.  Be careful when you bend or lift an object.  Bend at your knees, keep the object close to you, and do not twist.  Sleep on a firm mattress.  Lie on your side, and bend your knees.  If you lie on your back, put a pillow under your knees.  Only take medicines as told by your doctor.  Put ice on the injured area.  Put ice in a plastic bag  Place a towel between your skin and the bag  Leave the ice on for 15-20 minutes, 3-4 times a day for the first 2-3 days. 210 After that, you can switch between ice and heat packs.  Ask your doctor about back exercises or massage.  Avoid feeling anxious or stressed.  Find good ways to deal with stress, such as exercise.  Get Help Right Way If:  Your pain does not go away with rest or medicine.  Your pain does not go away in 1 week.  You have new problems.  You do not feel well.  The pain spreads into your legs.  You cannot control when you poop (bowel movement) or pee (urinate)  You feel sick  to your stomach (nauseous) or throw up (vomit)  You have belly (abdominal) pain.  You feel like you may pass out (faint).  If you develop a fever.  Make Sure you:  Understand these instructions.  Will watch your condition  Will get help right away if you are not doing well or get worse.  Your e-visit answers were reviewed by a board certified advanced clinical practitioner to complete your personal care plan.  Depending on the condition, your plan could have included both over the counter or prescription medications.  If there is a problem please reply  once you have received a response from your provider.  Your safety is important to .  If you have drug allergies check your prescription carefully.    You can use MyChart to ask questions about today's visit, request a non-urgent call back, or ask for a work or school excuse for 24 hours related to this e-Visit. If it has been greater than 24 hours you will need to follow up with your provider, or enter a new e-Visit to address those concerns.  You will get an e-mail in the next two days asking about your experience.  I hope that your e-visit has  been valuable and will speed your recovery. Thank you for using e-visits.  Greater than 5 minutes, yet less than 10 minutes of time have been spent researching, coordinating and implementing care for this patient today.

## 2020-07-15 ENCOUNTER — Other Ambulatory Visit: Payer: Self-pay | Admitting: Family Medicine

## 2020-08-03 ENCOUNTER — Ambulatory Visit (HOSPITAL_COMMUNITY)
Admission: EM | Admit: 2020-08-03 | Discharge: 2020-08-03 | Disposition: A | Discharge status: Home / Self Care | Payer: Self-pay | Attending: Medical Oncology | Admitting: Medical Oncology

## 2020-08-03 ENCOUNTER — Other Ambulatory Visit: Payer: Self-pay

## 2020-08-03 ENCOUNTER — Encounter (HOSPITAL_COMMUNITY): Payer: Self-pay

## 2020-08-03 DIAGNOSIS — R509 Fever, unspecified: Secondary | ICD-10-CM | POA: Insufficient documentation

## 2020-08-03 DIAGNOSIS — Z20822 Contact with and (suspected) exposure to covid-19: Secondary | ICD-10-CM | POA: Insufficient documentation

## 2020-08-03 DIAGNOSIS — R519 Headache, unspecified: Secondary | ICD-10-CM

## 2020-08-03 DIAGNOSIS — R6883 Chills (without fever): Secondary | ICD-10-CM | POA: Insufficient documentation

## 2020-08-03 DIAGNOSIS — J029 Acute pharyngitis, unspecified: Secondary | ICD-10-CM | POA: Insufficient documentation

## 2020-08-03 DIAGNOSIS — Z79899 Other long term (current) drug therapy: Secondary | ICD-10-CM | POA: Insufficient documentation

## 2020-08-03 DIAGNOSIS — F17210 Nicotine dependence, cigarettes, uncomplicated: Secondary | ICD-10-CM | POA: Insufficient documentation

## 2020-08-03 LAB — POCT RAPID STREP A, ED / UC: Streptococcus, Group A Screen (Direct): NEGATIVE

## 2020-08-03 LAB — POC INFLUENZA A AND B ANTIGEN (URGENT CARE ONLY)
Influenza A Ag: NEGATIVE
Influenza B Ag: NEGATIVE

## 2020-08-03 MED ORDER — AMOXICILLIN 500 MG PO CAPS: 500.0000 mg | ORAL_CAPSULE | Freq: Two times a day (BID) | ORAL | 0 refills | Status: DC

## 2020-08-03 NOTE — ED Triage Notes (Signed)
Pt in with c/o headache, body aches, ST and chills that started today  Pt has not had medication for sxs

## 2020-08-03 NOTE — ED Provider Notes (Signed)
[facility]    CSN: [id number] Arrival date & time: 08/03/20  1531      History   Chief Complaint Chief Complaint  Patient presents with  . Headache  . Sore Throat  . Chills    HPI Diana Oconnell is a 50 y.o. female.   HPI  Sore Throat: Pt reports that she has had sore throat, headache, chills and a low-grade fever that started today.  She also has a mild cough but is unsure if this is true cough or from the sore throat.  She denies any trouble breathing, trouble swallowing or vomiting but has had some nausea.  She has not tried anything for symptoms.  Her daughter was sick with a runny nose a few days ago.   Past Medical History:  Diagnosis Date  . Anemia   . Anxiety   . Depression   . Infection    UTI  . Pancreatic cyst     There are no problems to display for this patient.   Past Surgical History:  Procedure Laterality Date  . DILATION AND CURETTAGE OF UTERUS    . THERAPEUTIC ABORTION     multiple  . TUBAL LIGATION      OB History    Gravida  12   Para  5   Term  5   Preterm      AB  6   Living  5     SAB  3   IAB  3   Ectopic      Multiple      Live Births  5            Home Medications    Prior to Admission medications   Medication Sig Start Date End Date Taking? Authorizing Provider  amoxicillin (AMOXIL) 500 MG capsule Take 1 capsule (500 mg total) by mouth 2 (two) times daily. 08/03/20  Yes Janelly Switalski M, PA-C  acetaminophen (TYLENOL) 500 MG tablet Take 500 mg by mouth every 6 (six) hours as needed.    [provider]  benzonatate (TESSALON) 100 MG capsule Take 1-2 capsules (100-200 mg total) by mouth 3 (three) times daily as needed for cough. 05/21/20   Wallis Bamberg, PA-C  cetirizine (ZYRTEC) 10 MG tablet Take 1 tablet (10 mg total) by mouth daily. 05/21/20   Wallis Bamberg, PA-C  fluconazole (DIFLUCAN) 150 MG tablet Take 1 tablet today and 1 tablet when you finish the antibiotics. 07/14/19   Roxy Horseman, PA-C  fluticasone (FLONASE) 50 MCG/ACT nasal spray Place 2 sprays into both nostrils daily. 10/01/18   Eustace Moore, MD  hydrOXYzine (ATARAX/VISTARIL) 25 MG tablet Take 1-2 tablets (25-50 mg total) by mouth every 4 (four) hours as needed. 10/29/18   Eustace Moore, MD  ibuprofen (ADVIL) 800 MG tablet Take 1 tablet (800 mg total) by mouth 3 (three) times daily. 05/18/19   Wieters, Hallie C, PA-C  loratadine-pseudoephedrine (CLARITIN-D 12 HOUR) 5-120 MG tablet Take 1 tablet by mouth 2 (two) times daily. 10/01/18   Eustace Moore, MD  metroNIDAZOLE (FLAGYL) 500 MG tablet Take 1 tablet (500 mg total) by mouth 2 (two) times daily. 05/21/20   Wallis Bamberg, PA-C  naproxen (NAPROSYN) 500 MG tablet Take 1 tablet (500 mg total) by mouth 2 (two) times daily with a meal. 05/25/20   Bing Neighbors, FNP  promethazine-dextromethorphan (PROMETHAZINE-DM) 6.25-15 MG/5ML syrup Take 5 mLs by mouth at bedtime as needed for cough. 05/21/20   Wallis Bamberg, PA-C  pseudoephedrine (SUDAFED) 60 MG tablet Take 1 tablet (60 mg total) by mouth every 8 (eight) hours as needed for congestion. 05/21/20   Wallis Bamberg, PA-C  tiZANidine (ZANAFLEX) 4 MG tablet Take 1 tablet (4 mg total) by mouth at bedtime. 05/25/20   Bing Neighbors, FNP  tiZANidine (ZANAFLEX) 4 MG tablet Take 1 tablet (4 mg total) by mouth every 6 (six) hours as needed for muscle spasms. 06/17/20   Freeman Caldron, PA-C  triamcinolone cream (KENALOG) 0.1 % Apply 1 application topically 2 (two) times daily. 09/19/18   Wieters, Junius Creamer, PA-C    Family History Family History  Problem Relation Age of Onset  . Healthy Mother   . Hypertension Father     Social History Social History   Tobacco Use  . Smoking status: Current Every Day Smoker    Packs/day: 0.25    Years: 12.00    Pack years: 3.00    Types: Cigars  . Smokeless tobacco: Never Used  Vaping Use  . Vaping Use: Some days  Substance Use Topics  . Alcohol use: Yes    Alcohol/week: 7.0  standard drinks    Types: 7 Glasses of wine per week    Comment: occasional  . Drug use: Yes    Types: Marijuana    Comment: Smokes marijuana "all day every day"     Allergies   Patient has no known allergies.   Review of Systems Review of Systems  As stated above in HPI Physical Exam Triage Vital Signs ED Triage Vitals  Enc Vitals Group     BP 08/03/20 1608 111/76     Pulse Rate 08/03/20 1608 98     Resp 08/03/20 1608 18     Temp 08/03/20 1608 99.2 F (37.3 C)     Temp src --      SpO2 08/03/20 1608 98 %     Weight --      Height --      Head Circumference --      Peak Flow --      Pain Score 08/03/20 1607 10     Pain Loc --      Pain Edu? --      Excl. in GC? --    No data found.  Updated Vital Signs BP 111/76   Pulse 98   Temp 99.2 F (37.3 C)   Resp 18   SpO2 98%   Physical Exam Vitals and nursing note reviewed.  Constitutional:      General: She is not in acute distress.    Appearance: She is well-developed. She is ill-appearing. She is not toxic-appearing or diaphoretic.  HENT:     Head: Normocephalic and atraumatic.     Right Ear: Hearing, tympanic membrane, ear canal and external ear normal.     Left Ear: Hearing, tympanic membrane, ear canal and external ear normal.     Nose: Nose normal.     Mouth/Throat:     Mouth: Mucous membranes are moist.     Pharynx: Oropharynx is clear. Uvula midline. No uvula swelling.     Tonsils: Tonsillar exudate present. No tonsillar abscesses.  Eyes:     Extraocular Movements: Extraocular movements intact.     Pupils: Pupils are equal, round, and reactive to light.  Cardiovascular:     Rate and Rhythm: Normal rate and regular rhythm.     Heart sounds: Normal heart sounds.  Pulmonary:     Breath sounds: Normal breath sounds.  Abdominal:  General: Bowel sounds are normal.     Palpations: Abdomen is soft.  Musculoskeletal:     Cervical back: Normal range of motion and neck supple.  Lymphadenopathy:      Cervical: Cervical adenopathy present.  Skin:    General: Skin is warm.     Findings: No rash.  Neurological:     Mental Status: She is alert and oriented to person, place, and time.      UC Treatments / Results  Labs (all labs ordered are listed, but only abnormal results are displayed) Labs Reviewed  SARS CORONAVIRUS 2 (TAT 6-24 HRS)  CULTURE, GROUP A STREP ([facility])  POC INFLUENZA A AND B ANTIGEN (URGENT CARE ONLY)  POCT RAPID STREP A, ED / UC    EKG   Radiology No results found.  Procedures Procedures (including critical care time)  Medications Ordered in UC Medications - No data to display  Initial Impression / Assessment and Plan / UC Course  I have reviewed the triage vital signs and the nursing notes.  Pertinent labs & imaging results that were available during my care of the patient were reviewed by me and considered in my medical decision making (see chart for details).     New.  Appears to be streptococcal pharyngitis however given her cough we are also going to swab for influenza and COVID-19.  Given the risk for systemic illness if left untreated we are going to start her on Amoxil.  Discussed how to use along with common potential side effects and precautions.   Final Clinical Impressions(s) / UC Diagnoses   Final diagnoses:  Sore throat  Chills  Low grade fever   Discharge Instructions   None    ED Prescriptions    Medication Sig Dispense Auth. Provider   amoxicillin (AMOXIL) 500 MG capsule Take 1 capsule (500 mg total) by mouth 2 (two) times daily. 20 capsule Rushie Chestnut, [place]     PDMP not reviewed this encounter.   Rushie Chestnut, [place] 08/03/20 1703

## 2020-08-04 LAB — SARS CORONAVIRUS 2 (TAT 6-24 HRS): SARS Coronavirus 2: NEGATIVE

## 2020-08-05 ENCOUNTER — Telehealth: Payer: Self-pay | Admitting: Family

## 2020-08-05 DIAGNOSIS — J029 Acute pharyngitis, unspecified: Secondary | ICD-10-CM

## 2020-08-05 NOTE — Progress Notes (Signed)
We are sorry that you are not feeling well.  Here is how we plan to help!  Based on what you have shared with me it is likely that you have strep pharyngitis.  Strep pharyngitis is inflammation and infection in the back of the throat.  This is an infection cause by bacteria and is treated with antibiotics.  I recommend you continue the amoxicillin. Make sure you get a new toothbrush and wash all your cups well. For throat pain, we recommend over the counter oral pain relief medications such as acetaminophen or aspirin, or anti-inflammatory medications such as ibuprofen or naproxen sodium. Topical treatments such as oral throat lozenges or sprays may be used as needed. Strep infections are not as easily transmitted as other respiratory infections, however we still recommend that you avoid close contact with loved ones, especially the very young and elderly.  Remember to wash your hands thoroughly throughout the day as this is the number one way to prevent the spread of infection and wipe down door knobs and counters with disinfectant.   Home Care:  Only take medications as instructed by your medical team.  Complete the entire course of an antibiotic.  Do not take these medications with alcohol.  A steam or ultrasonic humidifier can help congestion.  You can place a towel over your head and breathe in the steam from hot water coming from a faucet.  Avoid close contacts especially the very young and the elderly.  Cover your mouth when you cough or sneeze.  Always remember to wash your hands.  Get Help Right Away If:  You develop worsening fever or sinus pain.  You develop a severe head ache or visual changes.  Your symptoms persist after you have completed your treatment plan.  Make sure you  Understand these instructions.  Will watch your condition.  Will get help right away if you are not doing well or get worse.  Your e-visit answers were reviewed by a board certified advanced  clinical practitioner to complete your personal care plan.  Depending on the condition, your plan could have included both over the counter or prescription medications.  If there is a problem please reply  once you have received a response from your provider.  Your safety is important to [place].  If you have drug allergies check your prescription carefully.    You can use MyChart to ask questions about today's visit, request a non-urgent call back, or ask for a work or school excuse for 24 hours related to this e-Visit. If it has been greater than 24 hours you will need to follow up with your provider, or enter a new e-Visit to address those concerns.  You will get an e-mail in the next two days asking about your experience.  I hope that your e-visit has been valuable and will speed your recovery. Thank you for using e-visits.   Approximately 5 minutes was spent documenting and reviewing patient's chart.

## 2020-08-06 LAB — CULTURE, GROUP A STREP (THRC)

## 2021-02-26 ENCOUNTER — Encounter (HOSPITAL_COMMUNITY): Payer: Self-pay | Admitting: Emergency Medicine

## 2021-02-26 ENCOUNTER — Ambulatory Visit (HOSPITAL_COMMUNITY)
Admission: EM | Admit: 2021-02-26 | Discharge: 2021-02-26 | Disposition: A | Discharge status: Home / Self Care | Payer: Self-pay | Attending: Student | Admitting: Student

## 2021-02-26 ENCOUNTER — Other Ambulatory Visit: Payer: Self-pay

## 2021-02-26 ENCOUNTER — Telehealth (HOSPITAL_COMMUNITY): Payer: Self-pay | Admitting: Emergency Medicine

## 2021-02-26 DIAGNOSIS — R059 Cough, unspecified: Secondary | ICD-10-CM

## 2021-02-26 DIAGNOSIS — R509 Fever, unspecified: Secondary | ICD-10-CM

## 2021-02-26 DIAGNOSIS — R0981 Nasal congestion: Secondary | ICD-10-CM

## 2021-02-26 DIAGNOSIS — J111 Influenza due to unidentified influenza virus with other respiratory manifestations: Secondary | ICD-10-CM

## 2021-02-26 DIAGNOSIS — R000 Tachycardia, unspecified: Secondary | ICD-10-CM

## 2021-02-26 LAB — POC INFLUENZA A AND B ANTIGEN (URGENT CARE ONLY)
INFLUENZA A ANTIGEN, POC: NEGATIVE
INFLUENZA B ANTIGEN, POC: NEGATIVE

## 2021-02-26 MED ORDER — AMOXICILLIN-POT CLAVULANATE 875-125 MG PO TABS: 1.0000 | ORAL_TABLET | Freq: Two times a day (BID) | ORAL | 0 refills | Status: DC

## 2021-02-26 MED ORDER — ACETAMINOPHEN 325 MG PO TABS
ORAL_TABLET | ORAL | Status: AC
  Filled 2021-02-26: qty 2

## 2021-02-26 MED ORDER — ALBUTEROL SULFATE HFA 108 (90 BASE) MCG/ACT IN AERS: 1.0000 | INHALATION_SPRAY | Freq: Four times a day (QID) | RESPIRATORY_TRACT | 0 refills | Status: DC | PRN

## 2021-02-26 MED ORDER — ACETAMINOPHEN 325 MG PO TABS
650.0000 mg | ORAL_TABLET | Freq: Once | ORAL | Status: AC
  Administered 2021-02-26: 650 mg via ORAL

## 2021-02-26 MED ORDER — PREDNISONE 20 MG PO TABS: 40.0000 mg | ORAL_TABLET | Freq: Every day | ORAL | 0 refills | Status: DC

## 2021-02-26 MED ORDER — PREDNISONE 20 MG PO TABS
40.0000 mg | ORAL_TABLET | Freq: Every day | ORAL | 0 refills | Status: AC
Start: 2021-02-26 — End: 2021-03-03

## 2021-02-26 NOTE — Discharge Instructions (Addendum)
-  Albuterol inhaler as needed for cough, wheezing, shortness of breath, 1 to 2 puffs every 4-6 hours as needed. Can increase to 6 puffs if necessary.  -Start the antibiotic-Augmentin (amoxicillin-clavulanate), 1 pill every 12 hours for 7 days.  You can take this with food like with breakfast and dinner. -Prednisone, 2 pills taken at the same time for 5 days in a row.  Try taking this earlier in the day as it can give you energy. Avoid NSAIDs like ibuprofen and alleve while taking this medication as they can increase your risk of stomach upset and even GI bleeding when in combination with a steroid. You can continue tylenol (acetaminophen) up to 1000mg  3x daily. -Follow-up if symptoms getting worse instead of better, like shortness of breath, fever/chills, weakness.

## 2021-02-26 NOTE — ED Provider Notes (Signed)
[facility]    CSN: [id number] Arrival date & time: 02/26/21  1903      History   Chief Complaint Chief Complaint  Patient presents with   Cough   Shortness of Breath   Fever    HPI Diana Oconnell is a 50 y.o. female presenting with about 3 days of viral symptoms.  Medical history noncontributory.  Describes cough, shortness of breath, fevers for 3 days.  Cough is nonproductive.  Shortness of breath with exertion.  Has not monitored temperature at home.  Taking Tylenol and Mucinex, last dose about 6 hours ago.  Denies history of cardiopulmonary disease.    HPI  Past Medical History:  Diagnosis Date   Anemia    Anxiety    Depression    Infection    UTI   Pancreatic cyst     There are no problems to display for this patient.   Past Surgical History:  Procedure Laterality Date   DILATION AND CURETTAGE OF UTERUS     THERAPEUTIC ABORTION     multiple   TUBAL LIGATION      OB History     Gravida  12   Para  5   Term  5   Preterm      AB  6   Living  5      SAB  3   IAB  3   Ectopic      Multiple      Live Births  5            Home Medications    Prior to Admission medications   Medication Sig Start Date End Date Taking? Authorizing Provider  albuterol (VENTOLIN HFA) 108 (90 Base) MCG/ACT inhaler Inhale 1-2 puffs into the lungs every 6 (six) hours as needed for wheezing or shortness of breath. 02/26/21  Yes Rhys Martini, PA-C  amoxicillin-clavulanate (AUGMENTIN) 875-125 MG tablet Take 1 tablet by mouth every 12 (twelve) hours. 02/26/21  Yes Rhys Martini, PA-C  predniSONE (DELTASONE) 20 MG tablet Take 2 tablets (40 mg total) by mouth daily for 5 days. Take with breakfast or lunch. Avoid NSAIDs (ibuprofen, etc) while taking this medication. 02/26/21 03/03/21 Yes Rhys Martini, PA-C  acetaminophen (TYLENOL) 500 MG tablet Take 500 mg by mouth every 6 (six) hours as needed.    [provider]  benzonatate  (TESSALON) 100 MG capsule Take 1-2 capsules (100-200 mg total) by mouth 3 (three) times daily as needed for cough. 05/21/20   Wallis Bamberg, PA-C  cetirizine (ZYRTEC) 10 MG tablet Take 1 tablet (10 mg total) by mouth daily. 05/21/20   Wallis Bamberg, PA-C  fluconazole (DIFLUCAN) 150 MG tablet Take 1 tablet today and 1 tablet when you finish the antibiotics. 07/14/19   Roxy Horseman, PA-C  fluticasone (FLONASE) 50 MCG/ACT nasal spray Place 2 sprays into both nostrils daily. 10/01/18   Eustace Moore, MD  hydrOXYzine (ATARAX/VISTARIL) 25 MG tablet Take 1-2 tablets (25-50 mg total) by mouth every 4 (four) hours as needed. 10/29/18   Eustace Moore, MD  ibuprofen (ADVIL) 800 MG tablet Take 1 tablet (800 mg total) by mouth 3 (three) times daily. 05/18/19   Wieters, Hallie C, PA-C  loratadine-pseudoephedrine (CLARITIN-D 12 HOUR) 5-120 MG tablet Take 1 tablet by mouth 2 (two) times daily. 10/01/18   Eustace Moore, MD  naproxen (NAPROSYN) 500 MG tablet Take 1 tablet (500 mg total) by mouth 2 (two) times daily with a meal. 05/25/20  Bing Neighbors, FNP  promethazine-dextromethorphan (PROMETHAZINE-DM) 6.25-15 MG/5ML syrup Take 5 mLs by mouth at bedtime as needed for cough. 05/21/20   Wallis Bamberg, PA-C  pseudoephedrine (SUDAFED) 60 MG tablet Take 1 tablet (60 mg total) by mouth every 8 (eight) hours as needed for congestion. 05/21/20   Wallis Bamberg, PA-C  tiZANidine (ZANAFLEX) 4 MG tablet Take 1 tablet (4 mg total) by mouth at bedtime. 05/25/20   Bing Neighbors, FNP  tiZANidine (ZANAFLEX) 4 MG tablet Take 1 tablet (4 mg total) by mouth every 6 (six) hours as needed for muscle spasms. 06/17/20   Freeman Caldron, PA-C  triamcinolone cream (KENALOG) 0.1 % Apply 1 application topically 2 (two) times daily. 09/19/18   Wieters, Junius Creamer, PA-C    Family History Family History  Problem Relation Age of Onset   Healthy Mother    Hypertension Father     Social History Social History   Tobacco Use   Smoking  status: Every Day    Packs/day: 0.25    Years: 12.00    Pack years: 3.00    Types: Cigars, Cigarettes   Smokeless tobacco: Never  Vaping Use   Vaping Use: Some days  Substance Use Topics   Alcohol use: Yes    Alcohol/week: 7.0 standard drinks    Types: 7 Glasses of wine per week    Comment: occasional   Drug use: Yes    Types: Marijuana    Comment: Smokes marijuana "all day every day"     Allergies   Patient has no known allergies.   Review of Systems Review of Systems  Constitutional:  Positive for chills and fever. Negative for appetite change.  HENT:  Positive for congestion. Negative for ear pain, rhinorrhea, sinus pressure, sinus pain and sore throat.   Eyes:  Negative for redness and visual disturbance.  Respiratory:  Positive for cough. Negative for chest tightness, shortness of breath and wheezing.   Cardiovascular:  Negative for chest pain and palpitations.  Gastrointestinal:  Negative for abdominal pain, constipation, diarrhea, nausea and vomiting.  Genitourinary:  Negative for dysuria, frequency and urgency.  Musculoskeletal:  Negative for myalgias.  Neurological:  Negative for dizziness, weakness and headaches.  Psychiatric/Behavioral:  Negative for confusion.   All other systems reviewed and are negative.   Physical Exam Triage Vital Signs ED Triage Vitals  Enc Vitals Group     BP 02/26/21 1938 131/80     Pulse Rate 02/26/21 1938 (!) 107     Resp 02/26/21 1938 20     Temp 02/26/21 1938 (!) 100.8 F (38.2 C)     Temp Source 02/26/21 1938 Oral     SpO2 02/26/21 1938 100 %     Weight --      Height --      Head Circumference --      Peak Flow --      Pain Score 02/26/21 1936 0     Pain Loc --      Pain Edu? --      Excl. in GC? --    No data found.  Updated Vital Signs BP 131/80 (BP Location: Right Arm)   Pulse (!) 107   Temp (!) 100.8 F (38.2 C) (Oral)   Resp 20   LMP 02/20/2021   SpO2 100%   Visual Acuity Right Eye Distance:   Left  Eye Distance:   Bilateral Distance:    Right Eye Near:   Left Eye Near:    Bilateral  Near:     Physical Exam Vitals reviewed.  Constitutional:      General: She is not in acute distress.    Appearance: Normal appearance. She is ill-appearing.  HENT:     Head: Normocephalic and atraumatic.     Right Ear: Tympanic membrane, ear canal and external ear normal. No tenderness. No middle ear effusion. There is no impacted cerumen. Tympanic membrane is not perforated, erythematous, retracted or bulging.     Left Ear: Tympanic membrane, ear canal and external ear normal. No tenderness.  No middle ear effusion. There is no impacted cerumen. Tympanic membrane is not perforated, erythematous, retracted or bulging.     Nose: Nose normal. No congestion.     Mouth/Throat:     Mouth: Mucous membranes are moist.     Pharynx: Uvula midline. No oropharyngeal exudate or posterior oropharyngeal erythema.  Eyes:     Extraocular Movements: Extraocular movements intact.     Pupils: Pupils are equal, round, and reactive to light.  Cardiovascular:     Rate and Rhythm: Regular rhythm. Tachycardia present.     Heart sounds: Normal heart sounds.  Pulmonary:     Effort: Pulmonary effort is normal.     Breath sounds: Rhonchi present. No decreased breath sounds, wheezing or rales.     Comments: Rhonchi throughout  Abdominal:     Palpations: Abdomen is soft.     Tenderness: There is no abdominal tenderness. There is no guarding or rebound.  Neurological:     General: No focal deficit present.     Mental Status: She is alert and oriented to person, place, and time.  Psychiatric:        Mood and Affect: Mood normal.        Behavior: Behavior normal.        Thought Content: Thought content normal.        Judgment: Judgment normal.     UC Treatments / Results  Labs (all labs ordered are listed, but only abnormal results are displayed) Labs Reviewed  POC INFLUENZA A AND B ANTIGEN (URGENT CARE ONLY)     EKG   Radiology No results found.  Procedures Procedures (including critical care time)  Medications Ordered in UC Medications  acetaminophen (TYLENOL) tablet 650 mg (has no administration in time range)    Initial Impression / Assessment and Plan / UC Course  I have reviewed the triage vital signs and the nursing notes.  Pertinent labs & imaging results that were available during my care of the patient were reviewed by me and considered in my medical decision making (see chart for details).     This patient is a very pleasant 50 y.o. year old female presenting with febrile illness. Febrile at 100.8, tachy at 107. Last antipyretic 6 hours ago. Tylenol administered during visit. Rhonchi throughout, but oxygenating well on room air. No history cardiopulm ds.  Rapid influenza negative. Will manage with prednisone, albuterol, augmentin given rhonchi. Will defer CXR for now, patient is in agreement.  Strict ED return precautions discussed. Patient verbalizes understanding and agreement.   Coding Level 4 for acute illness with systemic symptoms, and prescription drug management    Final Clinical Impressions(s) / UC Diagnoses   Final diagnoses:  Influenza-like illness     Discharge Instructions      -Albuterol inhaler as needed for cough, wheezing, shortness of breath, 1 to 2 puffs every 4-6 hours as needed. Can increase to 6 puffs if necessary.  -Start the antibiotic-Augmentin (amoxicillin-clavulanate),  1 pill every 12 hours for 7 days.  You can take this with food like with breakfast and dinner. -Prednisone, 2 pills taken at the same time for 5 days in a row.  Try taking this earlier in the day as it can give you energy. Avoid NSAIDs like ibuprofen and alleve while taking this medication as they can increase your risk of stomach upset and even GI bleeding when in combination with a steroid. You can continue tylenol (acetaminophen) up to 1000mg  3x daily. -Follow-up if  symptoms getting worse instead of better, like shortness of breath, fever/chills, weakness.     ED Prescriptions     Medication Sig Dispense Auth. Provider   amoxicillin-clavulanate (AUGMENTIN) 875-125 MG tablet Take 1 tablet by mouth every 12 (twelve) hours. 14 tablet , PA-C   predniSONE (DELTASONE) 20 MG tablet Take 2 tablets (40 mg total) by mouth daily for 5 days. Take with breakfast or lunch. Avoid NSAIDs (ibuprofen, etc) while taking this medication. 10 tablet Rhys Martini, PA-C   albuterol (VENTOLIN HFA) 108 (90 Base) MCG/ACT inhaler Inhale 1-2 puffs into the lungs every 6 (six) hours as needed for wheezing or shortness of breath. 1 each Rhys Martini, PA-C      PDMP not reviewed this encounter.   Rhys Martini, PA-C 02/26/21 2034

## 2021-02-26 NOTE — ED Triage Notes (Signed)
Cough, SOB, fevers started Sunday morning. Taking tylenol, mucinex. Last dose at 230pm today.

## 2021-11-23 ENCOUNTER — Ambulatory Visit (HOSPITAL_COMMUNITY)
Admission: EM | Admit: 2021-11-23 | Discharge: 2021-11-23 | Disposition: A | Discharge status: Home / Self Care | Payer: Self-pay | Attending: Internal Medicine | Admitting: Internal Medicine

## 2021-11-23 ENCOUNTER — Encounter (HOSPITAL_COMMUNITY): Payer: Self-pay

## 2021-11-23 ENCOUNTER — Ambulatory Visit (HOSPITAL_COMMUNITY): Admission: EM | Admit: 2021-11-23 | Discharge: 2021-11-23 | Discharge status: Against Medical Advice | Payer: Self-pay

## 2021-11-23 DIAGNOSIS — J020 Streptococcal pharyngitis: Secondary | ICD-10-CM | POA: Insufficient documentation

## 2021-11-23 DIAGNOSIS — R109 Unspecified abdominal pain: Secondary | ICD-10-CM | POA: Insufficient documentation

## 2021-11-23 DIAGNOSIS — J069 Acute upper respiratory infection, unspecified: Secondary | ICD-10-CM | POA: Insufficient documentation

## 2021-11-23 DIAGNOSIS — R6883 Chills (without fever): Secondary | ICD-10-CM | POA: Insufficient documentation

## 2021-11-23 LAB — POCT URINALYSIS DIPSTICK, ED / UC
Glucose, UA: NEGATIVE mg/dL
Hgb urine dipstick: NEGATIVE
Leukocytes,Ua: NEGATIVE
Nitrite: NEGATIVE
Protein, ur: 30 mg/dL — AB
Specific Gravity, Urine: >=1.03 (ref 1.005–1.030)
Urobilinogen, UA: 1 mg/dL (ref 0.0–1.0)
pH: 5.5 (ref 5.0–8.0)

## 2021-11-23 LAB — POC URINE PREG, ED: Preg Test, Ur: NEGATIVE

## 2021-11-23 LAB — POCT RAPID STREP A, ED / UC: Streptococcus, Group A Screen (Direct): NEGATIVE

## 2021-11-23 MED ORDER — AMOXICILLIN 500 MG PO CAPS: 500.0000 mg | ORAL_CAPSULE | Freq: Three times a day (TID) | ORAL | 0 refills | Status: DC

## 2021-11-23 MED ORDER — IBUPROFEN 800 MG PO TABS: 800.0000 mg | ORAL_TABLET | Freq: Three times a day (TID) | ORAL | 0 refills | Status: DC

## 2021-11-23 MED ORDER — LIDOCAINE VISCOUS HCL 2 % MT SOLN: 5.0000 mL | Freq: Four times a day (QID) | OROMUCOSAL | 0 refills | Status: DC

## 2021-11-23 NOTE — ED Provider Notes (Signed)
[facility]    CSN: [id number] Arrival date & time: 11/23/21  1356      History   Chief Complaint Chief Complaint  Patient presents with   Sore Throat   Flank Pain    HPI Diana Oconnell is a 51 y.o. female.   51 year old female presents with sore throat, right flank pain, frequency.  Patient indicates that she was around a friend who had strep 2 days ago.  Patient indicates for the past 2 days she has been having progressive sore throat and painful swallowing.  Patient indicates she is got white patches on her tonsils, and it really hurts to swallow.  Patient indicates she is also had some fever intermittently, chills, and body aches.  Patient denies nausea or vomiting. Patient also indicates for the past 2 months she has been having intermittent right flank pain which is localized.  Patient indicates that at times she will have the right flank pain and then she will have nausea associated without vomiting.  Patient indicates that the flank pain and nausea tend to occur more often when she is eating fried, spicy or greasy foods.  Patient indicates she does get some relief with ibuprofen but the pain returns.  Patient indicates she does not have a PCP.   Sore Throat  Flank Pain    Past Medical History:  Diagnosis Date   Anemia    Anxiety    Depression    Infection    UTI   Pancreatic cyst     There are no problems to display for this patient.   Past Surgical History:  Procedure Laterality Date   DILATION AND CURETTAGE OF UTERUS     THERAPEUTIC ABORTION     multiple   TUBAL LIGATION      OB History     Gravida  12   Para  5   Term  5   Preterm      AB  6   Living  5      SAB  3   IAB  3   Ectopic      Multiple      Live Births  5            Home Medications    Prior to Admission medications   Medication Sig Start Date End Date Taking? Authorizing Provider  amoxicillin (AMOXIL) 500 MG capsule Take 1 capsule (500 mg  total) by mouth 3 (three) times daily. 11/23/21  Yes Ellsworth Lennox, PA-C  ibuprofen (ADVIL) 800 MG tablet Take 1 tablet (800 mg total) by mouth 3 (three) times daily. 11/23/21  Yes Ellsworth Lennox, PA-C  magic mouthwash (lidocaine, diphenhydrAMINE, alum & mag hydroxide) suspension Swish and swallow 5 mLs 4 (four) times daily. 11/23/21  Yes Ellsworth Lennox, PA-C  acetaminophen (TYLENOL) 500 MG tablet Take 500 mg by mouth every 6 (six) hours as needed.    [provider]  albuterol (VENTOLIN HFA) 108 (90 Base) MCG/ACT inhaler Inhale 1-2 puffs into the lungs every 6 (six) hours as needed for wheezing or shortness of breath. 02/26/21   Rhys Martini, PA-C  amoxicillin-clavulanate (AUGMENTIN) 875-125 MG tablet Take 1 tablet by mouth every 12 (twelve) hours. 02/26/21   Rhys Martini, PA-C  benzonatate (TESSALON) 100 MG capsule Take 1-2 capsules (100-200 mg total) by mouth 3 (three) times daily as needed for cough. 05/21/20   Wallis Bamberg, PA-C  cetirizine (ZYRTEC) 10 MG tablet Take 1 tablet (10 mg total) by mouth daily.  05/21/20   Wallis Bamberg, PA-C  fluconazole (DIFLUCAN) 150 MG tablet Take 1 tablet today and 1 tablet when you finish the antibiotics. 07/14/19   Roxy Horseman, PA-C  fluticasone (FLONASE) 50 MCG/ACT nasal spray Place 2 sprays into both nostrils daily. 10/01/18   Eustace Moore, MD  hydrOXYzine (ATARAX/VISTARIL) 25 MG tablet Take 1-2 tablets (25-50 mg total) by mouth every 4 (four) hours as needed. 10/29/18   Eustace Moore, MD  loratadine-pseudoephedrine (CLARITIN-D 12 HOUR) 5-120 MG tablet Take 1 tablet by mouth 2 (two) times daily. 10/01/18   Eustace Moore, MD  naproxen (NAPROSYN) 500 MG tablet Take 1 tablet (500 mg total) by mouth 2 (two) times daily with a meal. 05/25/20   Bing Neighbors, FNP  promethazine-dextromethorphan (PROMETHAZINE-DM) 6.25-15 MG/5ML syrup Take 5 mLs by mouth at bedtime as needed for cough. 05/21/20   Wallis Bamberg, PA-C  pseudoephedrine (SUDAFED) 60 MG tablet  Take 1 tablet (60 mg total) by mouth every 8 (eight) hours as needed for congestion. 05/21/20   Wallis Bamberg, PA-C  tiZANidine (ZANAFLEX) 4 MG tablet Take 1 tablet (4 mg total) by mouth at bedtime. 05/25/20   Bing Neighbors, FNP  tiZANidine (ZANAFLEX) 4 MG tablet Take 1 tablet (4 mg total) by mouth every 6 (six) hours as needed for muscle spasms. 06/17/20   Freeman Caldron, PA-C  triamcinolone cream (KENALOG) 0.1 % Apply 1 application topically 2 (two) times daily. 09/19/18   Wieters, Junius Creamer, PA-C    Family History Family History  Problem Relation Age of Onset   Healthy Mother    Hypertension Father     Social History Social History   Tobacco Use   Smoking status: Every Day    Packs/day: 0.25    Years: 12.00    Total pack years: 3.00    Types: Cigars, Cigarettes   Smokeless tobacco: Never  Vaping Use   Vaping Use: Some days  Substance Use Topics   Alcohol use: Yes    Alcohol/week: 7.0 standard drinks of alcohol    Types: 7 Glasses of wine per week    Comment: occasional   Drug use: Yes    Types: Marijuana    Comment: Smokes marijuana "all day every day"     Allergies   Patient has no known allergies.   Review of Systems Review of Systems  HENT:  Positive for sore throat.   Genitourinary:  Positive for flank pain (right side).     Physical Exam Triage Vital Signs ED Triage Vitals [11/23/21 1533]  Enc Vitals Group     BP 112/77     Pulse Rate (!) 102     Resp 16     Temp 98.8 F (37.1 C)     Temp Source Oral     SpO2 100 %     Weight      Height      Head Circumference      Peak Flow      Pain Score      Pain Loc      Pain Edu?      Excl. in GC?    No data found.  Updated Vital Signs BP 112/77 (BP Location: Left Arm)   Pulse (!) 102   Temp 98.8 F (37.1 C) (Oral)   Resp 16   SpO2 100%   Visual Acuity Right Eye Distance:   Left Eye Distance:   Bilateral Distance:    Right Eye Near:   Left  Eye Near:    Bilateral Near:     Physical  Exam Constitutional:      Appearance: She is well-developed.  HENT:     Right Ear: Tympanic membrane and ear canal normal.     Left Ear: Tympanic membrane and ear canal normal.     Mouth/Throat:     Mouth: Mucous membranes are moist.     Pharynx: Uvula midline. Oropharyngeal exudate and posterior oropharyngeal erythema present. No pharyngeal swelling.  Cardiovascular:     Rate and Rhythm: Normal rate and regular rhythm.     Heart sounds: Normal heart sounds.  Pulmonary:     Effort: Pulmonary effort is normal.     Breath sounds: Normal breath sounds and air entry. No wheezing, rhonchi or rales.  Abdominal:     General: Abdomen is flat. Bowel sounds are normal.     Palpations: Abdomen is soft.     Tenderness: There is abdominal tenderness in the right upper quadrant. There is no right CVA tenderness, left CVA tenderness, guarding or rebound. Positive signs include Murphy's sign (mild on inspiration).  Lymphadenopathy:     Cervical: No cervical adenopathy.  Neurological:     Mental Status: She is alert.      UC Treatments / Results  Labs (all labs ordered are listed, but only abnormal results are displayed) Labs Reviewed  POCT URINALYSIS DIPSTICK, ED / UC - Abnormal; Notable for the following components:      Result Value   Bilirubin Urine SMALL (*)    Ketones, ur TRACE (*)    Protein, ur 30 (*)    All other components within normal limits  URINE CULTURE  CULTURE, GROUP A STREP ([facility])  POC URINE PREG, ED  POCT RAPID STREP A, ED / UC    EKG   Radiology No results found.  Procedures Procedures (including critical care time)  Medications Ordered in UC Medications - No data to display  Initial Impression / Assessment and Plan / UC Course  I have reviewed the triage vital signs and the nursing notes.  Pertinent labs & imaging results that were available during my care of the patient were reviewed by me and considered in my medical decision making (see chart for  details).    Plan: 1.  Urine culture is pending. 2.  Throat culture is pending. 3.  Advised patient take the amoxicillin 500 mg 1 every 8 hours till completed to treat the strep throat. 4.  Advised patient take ibuprofen 800 mg 1 every 8 hours to help reduce the flank pain and the throat pain. 5.  Patient advised to use the Magic mouthwash, gargle 60 seconds, spit half swallow half, 4-5 times a day to soothe throat. 6.  Patient's been advised to become established with a PCP so that her right flank pain can be evaluated and possibly evaluate for GB problems. Final Clinical Impressions(s) / UC Diagnoses   Final diagnoses:  Right flank pain  Pharyngitis due to Streptococcus species  Viral upper respiratory tract infection  Chills     Discharge Instructions      Advised to use the Magic mouthwash, gargle for 60 seconds, then spit have swelling of.  Do this 4-5 times throughout the day to help calm the sore throat. Advised to take the antibiotic amoxicillin 500 mg 1 every 8 hours to treat the strep throat. Advised take ibuprofen 800 mg 1 every 8 hours with food to help decrease the pain of the right side and the  sore throat. Advised to become established with a PCP in order to have the right flank pain evaluated. Advised to follow-up with PCP or return to urgent care if symptoms fail to improve.    ED Prescriptions     Medication Sig Dispense Auth. Provider   ibuprofen (ADVIL) 800 MG tablet Take 1 tablet (800 mg total) by mouth 3 (three) times daily. 21 tablet Ellsworth Lennox, PA-C   amoxicillin (AMOXIL) 500 MG capsule Take 1 capsule (500 mg total) by mouth 3 (three) times daily. 21 capsule Ellsworth Lennox, PA-C   magic mouthwash (lidocaine, diphenhydrAMINE, alum & mag hydroxide) suspension Swish and swallow 5 mLs 4 (four) times daily. 360 mL Ellsworth Lennox, PA-C      PDMP not reviewed this encounter.   Ellsworth Lennox, PA-C 11/23/21 1624

## 2021-11-23 NOTE — Discharge Instructions (Addendum)
Advised to use the Magic mouthwash, gargle for 60 seconds, then spit have swelling of.  Do this 4-5 times throughout the day to help calm the sore throat. Advised to take the antibiotic amoxicillin 500 mg 1 every 8 hours to treat the strep throat. Advised take ibuprofen 800 mg 1 every 8 hours with food to help decrease the pain of the right side and the sore throat. Advised to become established with a PCP in order to have the right flank pain evaluated. Advised to follow-up with PCP or return to urgent care if symptoms fail to improve.

## 2021-11-23 NOTE — ED Triage Notes (Signed)
Pt reports right sided flank pain x 2 weeks.  Pt reports sore throat x 2 days.

## 2021-11-24 LAB — URINE CULTURE: Culture: NO GROWTH

## 2021-11-25 LAB — CULTURE, GROUP A STREP (THRC)

## 2022-06-09 ENCOUNTER — Encounter (HOSPITAL_COMMUNITY): Payer: Self-pay

## 2022-06-09 ENCOUNTER — Other Ambulatory Visit: Payer: Self-pay

## 2022-06-09 ENCOUNTER — Emergency Department (HOSPITAL_COMMUNITY): Payer: Self-pay

## 2022-06-09 ENCOUNTER — Emergency Department (HOSPITAL_COMMUNITY)
Admission: EM | Admit: 2022-06-09 | Discharge: 2022-06-09 | Disposition: A | Discharge status: Home / Self Care | Payer: Self-pay | Attending: Emergency Medicine | Admitting: Emergency Medicine

## 2022-06-09 DIAGNOSIS — F17290 Nicotine dependence, other tobacco product, uncomplicated: Secondary | ICD-10-CM | POA: Insufficient documentation

## 2022-06-09 DIAGNOSIS — E041 Nontoxic single thyroid nodule: Secondary | ICD-10-CM | POA: Insufficient documentation

## 2022-06-09 LAB — CBC WITH DIFFERENTIAL/PLATELET
Abs Immature Granulocytes: 0.01 10*3/uL (ref 0.00–0.07)
Basophils Absolute: 0 10*3/uL (ref 0.0–0.1)
Basophils Relative: 1 %
Eosinophils Absolute: 0.1 10*3/uL (ref 0.0–0.5)
Eosinophils Relative: 3 %
HCT: 42.3 % (ref 36.0–46.0)
Hemoglobin: 14.3 g/dL (ref 12.0–15.0)
Immature Granulocytes: 0 %
Lymphocytes Relative: 55 %
Lymphs Abs: 2.4 10*3/uL (ref 0.7–4.0)
MCH: 31.4 pg (ref 26.0–34.0)
MCHC: 33.8 g/dL (ref 30.0–36.0)
MCV: 93 fL (ref 80.0–100.0)
Monocytes Absolute: 0.5 10*3/uL (ref 0.1–1.0)
Monocytes Relative: 11 %
Neutro Abs: 1.3 10*3/uL — ABNORMAL LOW (ref 1.7–7.7)
Neutrophils Relative %: 30 %
Platelets: 269 10*3/uL (ref 150–400)
RBC: 4.55 MIL/uL (ref 3.87–5.11)
RDW: 12.9 % (ref 11.5–15.5)
WBC: 4.4 10*3/uL (ref 4.0–10.5)
nRBC: 0 % (ref 0.0–0.2)

## 2022-06-09 LAB — COMPREHENSIVE METABOLIC PANEL
ALT: 18 U/L (ref 0–44)
AST: 17 U/L (ref 15–41)
Albumin: 3.6 g/dL (ref 3.5–5.0)
Alkaline Phosphatase: 62 U/L (ref 38–126)
Anion gap: 7 (ref 5–15)
BUN: 15 mg/dL (ref 6–20)
CO2: 24 mmol/L (ref 22–32)
Calcium: 9.3 mg/dL (ref 8.9–10.3)
Chloride: 107 mmol/L (ref 98–111)
Creatinine, Ser: 0.85 mg/dL (ref 0.44–1.00)
GFR, Estimated: >60 mL/min (ref >60)
Glucose, Bld: 90 mg/dL (ref 70–99)
Potassium: 3.6 mmol/L (ref 3.5–5.1)
Sodium: 138 mmol/L (ref 135–145)
Total Bilirubin: 1.2 mg/dL (ref 0.3–1.2)
Total Protein: 7.1 g/dL (ref 6.5–8.1)

## 2022-06-09 LAB — I-STAT BETA HCG BLOOD, ED (MC, WL, AP ONLY): I-stat hCG, quantitative: <5 m[IU]/mL (ref <5)

## 2022-06-09 LAB — TSH: TSH: 3.073 u[IU]/mL (ref 0.350–4.500)

## 2022-06-09 NOTE — Discharge Instructions (Signed)
Recommend making follow-up appointments with ear nose and throat and endocrinology.  Both specialty provided above along with the information to call.  Radiology is recommending biopsy of the right thyroid nodule.  Return for any new or worse symptoms.

## 2022-06-09 NOTE — ED Notes (Signed)
Patient transported to Ultrasound 

## 2022-06-09 NOTE — ED Notes (Signed)
DC instructions reviewed with pt. PT verbalized understanding.  Pt Dc 

## 2022-06-09 NOTE — ED Notes (Signed)
Patient transported to X-ray 

## 2022-06-09 NOTE — ED Provider Triage Note (Signed)
Emergency Medicine Provider Triage Evaluation Note  Diana Oconnell , a 52 y.o. female  was evaluated in triage.  Pt complains of mass on neck that has progressively been getting larger and now for the past couple days has had difficulty breathing d/t the mass. .  Review of Systems  Positive: +soft tissue mass Negative: fever  Physical Exam  BP 120/76   Pulse 64   Temp 98.3 F (36.8 C)   Resp 18   Ht 5\' 7"  (1.702 m)   Wt 72.6 kg   SpO2 97%   BMI 25.06 kg/m  Gen:   Awake, no distress   Resp:  Normal effort  MSK:   Moves extremities without difficulty  Other:  +indurated area along R  anterior aspect of neck  Medical Decision Making  Medically screening exam initiated at 9:55 AM.  Appropriate orders placed.  Diana Oconnell was informed that the remainder of the evaluation will be completed by another provider, this initial triage assessment does not replace that evaluation, and the importance of remaining in the ED until their evaluation is complete.     Osvaldo Shipper, [place] 06/09/22 1009

## 2022-06-09 NOTE — ED Provider Notes (Signed)
[facility] Provider Note   CSN: [id number] Arrival date & time: 06/09/22  4098     History  Chief Complaint  Patient presents with   Cyst    Diana Oconnell is a 52 y.o. female.  Patientpresentingwithaknotonherneckthisbeenpresentfor 3 months.  States is getting larger and she feels it is interfering with breathing.  Patient's oxygen saturations are 97% respirations fine there is no stridor patient talking well.  Patient does not have a primary care doctor.  Past medical history significant for pancreatic cyst anxiety depression.  Patient states she is an everyday smoker cigarettes and cigars.       Home Medications Prior to Admission medications   Medication Sig Start Date End Date Taking? Authorizing Provider  naproxen (NAPROSYN) 500 MG tablet Take 1 tablet (500 mg total) by mouth 2 (two) times daily with a meal. 05/25/20  Yes Scot Jun, NP  acetaminophen (TYLENOL) 500 MG tablet Take 500 mg by mouth every 6 (six) hours as needed. Patient not taking: Reported on 06/09/2022    [provider]  albuterol (VENTOLIN HFA) 108 (90 Base) MCG/ACT inhaler Inhale 1-2 puffs into the lungs every 6 (six) hours as needed for wheezing or shortness of breath. Patient not taking: Reported on 06/09/2022 02/26/21   Hazel Sams, PA-C  amoxicillin (AMOXIL) 500 MG capsule Take 1 capsule (500 mg total) by mouth 3 (three) times daily. Patient not taking: Reported on 06/09/2022 11/23/21   Nyoka Lint, PA-C  amoxicillin-clavulanate (AUGMENTIN) 875-125 MG tablet Take 1 tablet by mouth every 12 (twelve) hours. Patient not taking: Reported on 06/09/2022 02/26/21   Hazel Sams, PA-C  benzonatate (TESSALON) 100 MG capsule Take 1-2 capsules (100-200 mg total) by mouth 3 (three) times daily as needed for cough. Patient not taking: Reported on 06/09/2022 05/21/20   Jaynee Eagles, PA-C  cetirizine (ZYRTEC) 10 MG tablet Take 1 tablet (10 mg total) by  mouth daily. Patient not taking: Reported on 06/09/2022 05/21/20   Jaynee Eagles, PA-C  fluconazole (DIFLUCAN) 150 MG tablet Take 1 tablet today and 1 tablet when you finish the antibiotics. Patient not taking: Reported on 06/09/2022 07/14/19   Montine Circle, PA-C  fluticasone [facility]) 50 MCG/ACT nasal spray Place 2 sprays into both nostrils daily. Patient not taking: Reported on 06/09/2022 10/01/18   Raylene Everts, MD  hydrOXYzine (ATARAX/VISTARIL) 25 MG tablet Take 1-2 tablets (25-50 mg total) by mouth every 4 (four) hours as needed. Patient not taking: Reported on 06/09/2022 10/29/18   Raylene Everts, MD  ibuprofen (ADVIL) 800 MG tablet Take 1 tablet (800 mg total) by mouth 3 (three) times daily. Patient not taking: Reported on 06/09/2022 11/23/21   Nyoka Lint, PA-C  loratadine-pseudoephedrine (CLARITIN-D 12 HOUR) 5-120 MG tablet Take 1 tablet by mouth 2 (two) times daily. Patient not taking: Reported on 06/09/2022 10/01/18   Raylene Everts, MD  magic mouthwash (lidocaine, diphenhydrAMINE, alum & mag hydroxide) suspension Swish and swallow 5 mLs 4 (four) times daily. Patient not taking: Reported on 06/09/2022 11/23/21   Nyoka Lint, PA-C  promethazine-dextromethorphan (PROMETHAZINE-DM) 6.25-15 MG/5ML syrup Take 5 mLs by mouth at bedtime as needed for cough. Patient not taking: Reported on 06/09/2022 05/21/20   Jaynee Eagles, PA-C  pseudoephedrine (SUDAFED) 60 MG tablet Take 1 tablet (60 mg total) by mouth every 8 (eight) hours as needed for congestion. Patient not taking: Reported on 06/09/2022 05/21/20   Jaynee Eagles, PA-C  tiZANidine (ZANAFLEX) 4 MG tablet Take 1  tablet (4 mg total) by mouth at bedtime. Patient not taking: Reported on 06/09/2022 05/25/20   Scot Jun, NP  tiZANidine (ZANAFLEX) 4 MG tablet Take 1 tablet (4 mg total) by mouth every 6 (six) hours as needed for muscle spasms. Patient not taking: Reported on 06/09/2022 06/17/20   Lisette Abu, PA-C  triamcinolone cream  (KENALOG) 0.1 % Apply 1 application topically 2 (two) times daily. Patient not taking: Reported on 06/09/2022 09/19/18   Debara Pickett C, PA-C      Allergies    Patient has no known allergies.    Review of Systems   Review of Systems  Constitutional:  Negative for chills and fever.  HENT:  Negative for ear pain, sore throat, trouble swallowing and voice change.   Eyes:  Negative for pain and visual disturbance.  Respiratory:  Negative for cough and shortness of breath.   Cardiovascular:  Negative for chest pain and palpitations.  Gastrointestinal:  Negative for abdominal pain and vomiting.  Genitourinary:  Negative for dysuria and hematuria.  Musculoskeletal:  Negative for arthralgias and back pain.  Skin:  Negative for color change and rash.  Neurological:  Negative for seizures, syncope, facial asymmetry and speech difficulty.  All other systems reviewed and are negative.   Physical Exam Updated Vital Signs BP 120/76   Pulse 64   Temp 98.3 F (36.8 C)   Resp 18   Ht 1.702 m (5\' 7" )   Wt 72.6 kg   SpO2 97%   BMI 25.06 kg/m  Physical Exam Vitals and nursing note reviewed.  Constitutional:      General: She is not in acute distress.    Appearance: Normal appearance. She is well-developed.  HENT:     Head: Normocephalic and atraumatic.  Eyes:     Extraocular Movements: Extraocular movements intact.     Conjunctiva/sclera: Conjunctivae normal.     Pupils: Pupils are equal, round, and reactive to light.  Neck:     Comments: Thyroid mass palpable in the right side.  No other cervical adenopathy. Cardiovascular:     Rate and Rhythm: Normal rate and regular rhythm.     Heart sounds: No murmur heard. Pulmonary:     Effort: Pulmonary effort is normal. No respiratory distress.     Breath sounds: Normal breath sounds. No stridor. No wheezing, rhonchi or rales.  Abdominal:     Palpations: Abdomen is soft.     Tenderness: There is no abdominal tenderness.  Musculoskeletal:         General: No swelling.     Cervical back: Normal range of motion and neck supple.  Lymphadenopathy:     Cervical: No cervical adenopathy.  Skin:    General: Skin is warm and dry.     Capillary Refill: Capillary refill takes less than 2 seconds.  Neurological:     General: No focal deficit present.     Mental Status: She is alert and oriented to person, place, and time.     Cranial Nerves: No cranial nerve deficit.     Sensory: No sensory deficit.     Motor: No weakness.  Psychiatric:        Mood and Affect: Mood normal.     ED Results / Procedures / Treatments   Labs (all labs ordered are listed, but only abnormal results are displayed) Labs Reviewed  CBC WITH DIFFERENTIAL/PLATELET - Abnormal; Notable for the following components:      Result Value   Neutro Abs 1.3 (*)  All other components within normal limits  COMPREHENSIVE METABOLIC PANEL  TSH  I-STAT BETA HCG BLOOD, ED ([facility], [facility], [facility] ONLY)    EKG None  Radiology US THYROID  Result Date: 06/09/2022 CLINICAL DATA:  Mass EXAM: THYROID ULTRASOUND TECHNIQUE: Ultrasound examination of the thyroid gland and adjacent soft tissues was performed. COMPARISON:  None Available. FINDINGS: Parenchymal Echotexture: Moderately heterogenous Isthmus: 0.1 cm thickness Right lobe: 5.8 x 2.1 x 3 cm Left lobe: 4.7 x 1 x 1.7 cm _________________________________________________________ Estimated total number of nodules >/= 1 cm: 3 Number of spongiform nodules >/=  2 cm not described below (TR1): 0 Number of mixed cystic and solid nodules >/= 1.5 cm not described below (TR2): 0 _________________________________________________________ Nodule # 1: Location: Right; mid Maximum size: 3.2 cm; Other 2 dimensions: 1.8 x 2.8 cm Composition: solid/almost completely solid (2) Echogenicity: isoechoic (1) Shape: not taller-than-wide (0) Margins: smooth (0) Echogenic foci: none (0) ACR TI-RADS total points: 3. ACR TI-RADS risk category: TR 3. ACR TI-RADS  recommendations: **Given size (>/= 2.5 cm) and appearance, fine needle aspiration of this mildly suspicious nodule should be considered based on TI-RADS criteria. _________________________________________________________ Nodule # 2: Location: Right; superior Maximum size: 1.3 cm; Other 2 dimensions: 1 x 1.2 cm Composition: solid/almost completely solid (2) Echogenicity: isoechoic (1) Shape: not taller-than-wide (0) Margins: ill-defined (0) Echogenic foci: none (0) ACR TI-RADS total points: 3. ACR TI-RADS risk category: TR 3. ACR TI-RADS recommendations: Given size (<1.4 cm) and appearance, this nodule does NOT meet TI-RADS criteria for biopsy or dedicated follow-up. _________________________________________________________ Nodule 3: 1 cm isoechoic nodule without calcifications, inferior left; This nodule does NOT meet TI-RADS criteria for biopsy or dedicated follow-up. 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 Borderline thyromegaly with bilateral nodules. 2. Recommend FNA biopsy of mildly suspicious 3.2 cm Mid right nodule. The above is in keeping with the ACR TI-RADS recommendations - J Am Coll Radiol 2017;14:587-595. Electronically Signed   By: Corlis Leak M.D.   On: 06/09/2022 15:39    Procedures Procedures    Medications Ordered in ED Medications - No data to display  ED Course/ Medical Decision Making/ A&P                             Medical Decision Making Amount and/or Complexity of Data Reviewed Radiology: ordered.   Patient with thyroid nodule.  Thyroid-stimulating hormone is normal.  Pregnancy test negative.  CBC normal.  Complete metabolic panel including LFTs normal.  TSH was 3.073.  Ultrasound of the thyroid was performed since patient does not have a primary care doctor.  And is borderline thyromegaly with bilateral nodules.  They are recommending fine-needle aspiration biopsy of a mildly suspicious nodule on the mid right side.  Patient understands the  importance for follow-up.  Patient given follow-up to endocrine and ear nose and throat.   Final Clinical Impression(s) / ED Diagnoses Final diagnoses:  Thyroid nodule    Rx / DC Orders ED Discharge Orders     None         Vanetta Mulders, MD 06/09/22 1626

## 2022-06-09 NOTE — ED Triage Notes (Signed)
Patient reports she has a knot on her neck that is getting larger and interferes with her breathing.  Reports she smokes cigars.

## 2022-07-15 ENCOUNTER — Ambulatory Visit (HOSPITAL_COMMUNITY)
Admission: EM | Admit: 2022-07-15 | Discharge: 2022-07-15 | Disposition: A | Discharge status: Home / Self Care | Payer: Self-pay | Attending: Family Medicine | Admitting: Family Medicine

## 2022-07-15 ENCOUNTER — Encounter (HOSPITAL_COMMUNITY): Payer: Self-pay | Admitting: *Deleted

## 2022-07-15 ENCOUNTER — Ambulatory Visit (INDEPENDENT_AMBULATORY_CARE_PROVIDER_SITE_OTHER): Payer: Self-pay

## 2022-07-15 DIAGNOSIS — M25552 Pain in left hip: Secondary | ICD-10-CM

## 2022-07-15 DIAGNOSIS — M549 Dorsalgia, unspecified: Secondary | ICD-10-CM

## 2022-07-15 DIAGNOSIS — M25562 Pain in left knee: Secondary | ICD-10-CM

## 2022-07-15 DIAGNOSIS — M25561 Pain in right knee: Secondary | ICD-10-CM

## 2022-07-15 DIAGNOSIS — M25551 Pain in right hip: Secondary | ICD-10-CM

## 2022-07-15 MED ORDER — KETOROLAC TROMETHAMINE 30 MG/ML IJ SOLN
INTRAMUSCULAR | Status: AC
  Filled 2022-07-15: qty 1

## 2022-07-15 MED ORDER — CYCLOBENZAPRINE HCL 10 MG PO TABS: 10.0000 mg | ORAL_TABLET | Freq: Three times a day (TID) | ORAL | 0 refills | Status: DC | PRN

## 2022-07-15 MED ORDER — KETOROLAC TROMETHAMINE 30 MG/ML IJ SOLN
30.0000 mg | Freq: Once | INTRAMUSCULAR | Status: AC
  Administered 2022-07-15: 30 mg via INTRAMUSCULAR
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
## 2022-07-17 ENCOUNTER — Encounter: Payer: Self-pay | Admitting: Radiology

## 2022-08-28 ENCOUNTER — Encounter (HOSPITAL_COMMUNITY): Payer: Self-pay

## 2022-08-28 ENCOUNTER — Ambulatory Visit (HOSPITAL_COMMUNITY)
Admission: EM | Admit: 2022-08-28 | Discharge: 2022-08-28 | Disposition: A | Discharge status: Home / Self Care | Payer: Self-pay | Attending: Emergency Medicine | Admitting: Emergency Medicine

## 2022-08-28 DIAGNOSIS — M62830 Muscle spasm of back: Secondary | ICD-10-CM

## 2022-08-28 MED ORDER — ACETAMINOPHEN 325 MG PO TABS
975.0000 mg | ORAL_TABLET | Freq: Once | ORAL | Status: AC
  Administered 2022-08-28: 975 mg via ORAL

## 2022-08-28 MED ORDER — CYCLOBENZAPRINE HCL 10 MG PO TABS: 10.0000 mg | ORAL_TABLET | Freq: Two times a day (BID) | ORAL | 0 refills | Status: AC

## 2022-08-28 MED ORDER — ACETAMINOPHEN 325 MG PO TABS
ORAL_TABLET | ORAL | Status: AC
  Filled 2022-08-28: qty 3
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
## 2022-08-28 NOTE — Discharge Instructions (Addendum)
Drink plenty of water, may alternate Tylenol ibuprofen as label directed for pain.  Follow-up with PCP, return as needed.  Go immediately to the ER if you develop saddle numbness, loss of function, loss of bowel and bladder, or worsening symptoms

## 2022-09-15 ENCOUNTER — Telehealth: Payer: Self-pay | Admitting: Physician Assistant

## 2022-09-15 DIAGNOSIS — J4520 Mild intermittent asthma, uncomplicated: Secondary | ICD-10-CM

## 2022-09-15 MED ORDER — ALBUTEROL SULFATE HFA 108 (90 BASE) MCG/ACT IN AERS: 1.0000 | INHALATION_SPRAY | Freq: Four times a day (QID) | RESPIRATORY_TRACT | 0 refills | Status: DC | PRN

## 2022-09-15 NOTE — Patient Instructions (Signed)
  Yanci Black-Poland, thank you for joining [name], PA-C for today's virtual visit.  While this provider is not your primary care provider (PCP), if your PCP is located in our provider database this encounter information will be shared with them immediately following your visit.   A [facility] MyChart account gives you access to today's visit and all your visits, tests, and labs performed at [facility] " click here if you don't have a [facility] MyChart account or go to [email or web address]  Consent: (Patient) Tyshell Black-Poland provided verbal consent for this virtual visit at the beginning of the encounter.  Current Medications: No current outpatient medications on file.   Medications ordered in this encounter:  No orders of the defined types were placed in this encounter.    *If you need refills on other medications prior to your next appointment, please contact your pharmacy*  Follow-Up: Call back or seek an in-person evaluation if the symptoms worsen or if the condition fails to improve as anticipated.  [facility] [phone]  Other Instructions Please use the inhaler as directed. I recommend switching antihistamine to a once daily Xyzal OTC, and starting Flonase spray once daily. I also recommend if reasonable cost, to get an air purifier to run at least in your bedroom.  Use link below to get scheduled with a new primary provider for ongoing care needs.    If you have been instructed to have an in-person evaluation today at a local Urgent Care facility, please use the link below. It will take you to a list of all of our available [facility], including address, phone number and hours of operation. Please do not delay care.  [facility]  If you or a family member do not have a primary care provider, use the link below to schedule a visit and establish care. When you choose a [facility] primary care  physician or advanced practice provider, you gain a long-term partner in health. Find a Primary Care Provider  Learn more about [facility]'s in-office and virtual care options:  - Get Care Now

## 2022-09-15 NOTE — Progress Notes (Signed)
Virtual Visit Consent   Diana Oconnell, you are scheduled for a virtual visit with a [facility] provider today. Just as with appointments in the office, your consent must be obtained to participate. Your consent will be active for this visit and any virtual visit you may have with one of our providers in the next 365 days. If you have a MyChart account, a copy of this consent can be sent to you electronically.  As this is a virtual visit, video technology does not allow for your provider to perform a traditional examination. This may limit your provider's ability to fully assess your condition. If your provider identifies any concerns that need to be evaluated in person or the need to arrange testing (such as labs, EKG, etc.), we will make arrangements to do so. Although advances in technology are sophisticated, we cannot ensure that it will always work on either your end or our end. If the connection with a video visit is poor, the visit may have to be switched to a telephone visit. With either a video or telephone visit, we are not always able to ensure that we have a secure connection.  By engaging in this virtual visit, you consent to the provision of healthcare and authorize for your insurance to be billed (if applicable) for the services provided during this visit. Depending on your insurance coverage, you may receive a charge related to this service.  I need to obtain your verbal consent now. Are you willing to proceed with your visit today? Diana Oconnell has provided verbal consent on 09/15/2022 for a virtual visit (video or telephone). Diana Oconnell, [place]  Date: 09/15/2022 5:05 PM  Virtual Visit via Video Note   I, Diana Oconnell, connected with  Latarsha Zani  ([id number], [birth year]) on 09/15/22 at  5:00 PM EDT by a video-enabled telemedicine application and verified that I am speaking with the correct person using two identifiers.  Location: Patient:  Virtual Visit Location Patient: Home Provider: Virtual Visit Location Provider: Home Office   I discussed the limitations of evaluation and management by telemedicine and the availability of in person appointments. The patient expressed understanding and agreed to proceed.    History of Present Illness: Diana Oconnell is a 52 y.o. who identifies as a female who was assigned female at birth, and is being seen today for medication refill. Has history of some allergic asthma symptoms in the spring. Has previously had script for albuterol that she uses as needed. Notes having to use again periodically in the past few weeks. Is taking Claritin and Benadryl.    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    Allergies: No Known Allergies Medications:  Current Outpatient Medications:    albuterol (VENTOLIN HFA) 108 (90 Base) MCG/ACT inhaler, Inhale 1-2 puffs into the lungs every 6 (six) hours as needed for wheezing or shortness of breath., Disp: 8 g, Rfl: 0  Observations/Objective: Patient is well-developed, well-nourished in no acute distress.  Resting comfortably at home.  Head is normocephalic, atraumatic.  No labored breathing. Speech is clear and coherent with logical content.  Patient is alert and oriented at baseline.   Assessment and Plan: 1. Mild intermittent extrinsic asthma without complication - albuterol (VENTOLIN HFA) 108 (90 Base) MCG/ACT inhaler; Inhale 1-2 puffs into the lungs every 6 (six) hours as needed for wheezing or shortness of breath.  Dispense: 8 g; Refill: 0  Albuterol refilled. Start Xyzal once daily along with OTC Flonase nasal spray. Resources for new PCP sent to her MyChart.   Follow Up Instructions: I discussed the assessment and treatment plan with the patient. The patient was provided an opportunity to ask questions and all were answered. The patient agreed with the plan and  demonstrated an understanding of the instructions.  A copy of instructions were sent to the patient via MyChart unless otherwise noted below.   The patient was advised to call back or seek an in-person evaluation if the symptoms worsen or if the condition fails to improve as anticipated.  Time:  I spent 10 minutes with the patient via telehealth technology discussing the above problems/concerns.    Diana Climes, PA-C

## 2023-07-29 ENCOUNTER — Ambulatory Visit (INDEPENDENT_AMBULATORY_CARE_PROVIDER_SITE_OTHER): Payer: Self-pay

## 2023-07-29 ENCOUNTER — Encounter (HOSPITAL_COMMUNITY): Payer: Self-pay

## 2023-07-29 ENCOUNTER — Ambulatory Visit (HOSPITAL_COMMUNITY)
Admission: EM | Admit: 2023-07-29 | Discharge: 2023-07-29 | Disposition: A | Discharge status: Home / Self Care | Payer: Self-pay

## 2023-07-29 DIAGNOSIS — S161XXA Strain of muscle, fascia and tendon at neck level, initial encounter: Secondary | ICD-10-CM

## 2023-07-29 DIAGNOSIS — S39012A Strain of muscle, fascia and tendon of lower back, initial encounter: Secondary | ICD-10-CM

## 2023-07-29 DIAGNOSIS — M545 Low back pain, unspecified: Secondary | ICD-10-CM

## 2023-07-29 MED ORDER — DICLOFENAC SODIUM 75 MG PO TBEC: 75.0000 mg | DELAYED_RELEASE_TABLET | Freq: Two times a day (BID) | ORAL | 0 refills | Status: DC

## 2023-07-29 MED ORDER — KETOROLAC TROMETHAMINE 30 MG/ML IJ SOLN
30.0000 mg | Freq: Once | INTRAMUSCULAR | Status: AC
  Administered 2023-07-29: 30 mg via INTRAMUSCULAR

## 2023-07-29 MED ORDER — KETOROLAC TROMETHAMINE 30 MG/ML IJ SOLN
INTRAMUSCULAR | Status: AC
  Filled 2023-07-29: qty 1

## 2023-07-29 MED ORDER — CYCLOBENZAPRINE HCL 10 MG PO TABS: 10.0000 mg | ORAL_TABLET | Freq: Two times a day (BID) | ORAL | 0 refills | Status: DC | PRN
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
## 2023-07-29 NOTE — Discharge Instructions (Addendum)
 1. Acute strain of neck muscle, initial encounter - diclofenac (VOLTAREN) 75 MG EC tablet; Take 1 tablet (75 mg total) by mouth 2 (two) times daily for pain and inflammation of the back and neck - cyclobenzaprine (FLEXERIL) 10 MG tablet; Take 1 tablet (10 mg total) by mouth 2 (two) times daily as needed for muscle spasms.    2. Strain of lumbar region, initial encounter (Primary) - DG Lumbar Spine Complete shows no acute fracture, no sign of disc dysfunction, mild degenerative changes.  Final radiologist read still pending should be available tomorrow.  If any abnormality noted you will be contacted and appropriate management provided. - ketorolac (TORADOL) 30 MG/ML injection 30 mg given in UC for acute lower back pain and neck/shoulder pain. -Continue to monitor symptoms if any change follow-up for further evaluation and management.

## 2023-08-14 ENCOUNTER — Ambulatory Visit (HOSPITAL_COMMUNITY): Admission: EM | Admit: 2023-08-14 | Discharge: 2023-08-14 | Disposition: A | Discharge status: Home / Self Care

## 2023-08-14 ENCOUNTER — Encounter (HOSPITAL_COMMUNITY): Payer: Self-pay

## 2023-08-14 DIAGNOSIS — B349 Viral infection, unspecified: Secondary | ICD-10-CM | POA: Diagnosis not present

## 2023-08-14 LAB — POC COVID19/FLU A&B COMBO
Covid Antigen, POC: NEGATIVE
Influenza A Antigen, POC: NEGATIVE
Influenza B Antigen, POC: NEGATIVE

## 2023-08-14 MED ORDER — BENZONATATE 200 MG PO CAPS: 200.0000 mg | ORAL_CAPSULE | Freq: Three times a day (TID) | ORAL | 0 refills | Status: DC | PRN

## 2023-08-14 MED ORDER — AZELASTINE HCL 0.1 % NA SOLN: 1.0000 | Freq: Two times a day (BID) | NASAL | 1 refills | Status: AC

## 2023-08-14 MED ORDER — LORATADINE 10 MG PO TABS: 10.0000 mg | ORAL_TABLET | Freq: Every day | ORAL | 3 refills | Status: DC

## 2023-08-14 NOTE — ED Triage Notes (Addendum)
 Chief Complaint: fever, congestion, body aches.   Sick exposure: Yes- Her grandson is sick but was not tested.   Onset: 2 days ago   Prescriptions or OTC medications tried: Yes- muscle relaxer, Diclofenac, Tylenol cold and flu     with little relief  New foods, medications, or products: No  Recent Travel: No

## 2023-08-14 NOTE — Discharge Instructions (Addendum)
 1. Viral syndrome (Primary) - POC Covid19/Flu A&B Antigen completed in UC is negative for COVID and influenza - loratadine (CLARITIN) 10 MG tablet; Take 1 tablet (10 mg total) by mouth daily.  Dispense: 30 tablet; Refill: 3 - azelastine (ASTELIN) 0.1 % nasal spray; Place 1 spray into both nostrils 2 (two) times daily. Use in each nostril as directed  Dispense: 30 mL; Refill: 1 - benzonatate (TESSALON) 200 MG capsule; Take 1 capsule (200 mg total) by mouth 3 (three) times daily as needed for cough.  Dispense: 20 capsule; Refill: 0 -Take medication as directed and continue to monitor for changes in symptoms, any escalation of current symptoms or development of new symptoms follow-up for further evaluation and management in the emergency department or with PCP.

## 2023-08-14 NOTE — ED Provider Notes (Signed)
 [facility]   Note:  This document was prepared using Dragon voice recognition software and may include unintentional dictation errors.  MRN: [id number] DOB: 12-09-1970  Subjective:   Diana Oconnell is a 53 y.o. female presenting for fever, nasal congestion, cough x 2 days.  Patient reports that her grandson has been sick but no known diagnosis of COVID or influenza.  Patient has been using Tylenol cough and cold with minimal improvement of symptoms.  Patient denies any shortness of breath, chest pain, weakness, dizziness, abdominal pain, back pain, nausea or vomiting, diarrhea.  No current facility-administered medications for this encounter.  Current Outpatient Medications:    azelastine (ASTELIN) 0.1 % nasal spray, Place 1 spray into both nostrils 2 (two) times daily. Use in each nostril as directed, Disp: 30 mL, Rfl: 1   benzonatate (TESSALON) 200 MG capsule, Take 1 capsule (200 mg total) by mouth 3 (three) times daily as needed for cough., Disp: 20 capsule, Rfl: 0   loratadine (CLARITIN) 10 MG tablet, Take 1 tablet (10 mg total) by mouth daily., Disp: 30 tablet, Rfl: 3   albuterol (VENTOLIN HFA) 108 (90 Base) MCG/ACT inhaler, Inhale 1-2 puffs into the lungs every 6 (six) hours as needed for wheezing or shortness of breath., Disp: 8 g, Rfl: 0   cyclobenzaprine (FLEXERIL) 10 MG tablet, Take 1 tablet (10 mg total) by mouth 2 (two) times daily as needed for muscle spasms., Disp: 20 tablet, Rfl: 0   diclofenac (VOLTAREN) 75 MG EC tablet, Take 1 tablet (75 mg total) by mouth 2 (two) times daily., Disp: 30 tablet, Rfl: 0   No Known Allergies  Past Medical History:  Diagnosis Date   Anemia    Anxiety    Depression    Infection    UTI   Pancreatic cyst      Past Surgical History:  Procedure Laterality Date   DILATION AND CURETTAGE OF UTERUS     THERAPEUTIC ABORTION     multiple   TUBAL LIGATION      Family History  Problem Relation Age of Onset   Healthy  Mother    Hypertension Father     Social History   Tobacco Use   Smoking status: Former    Current packs/day: 0.25    Average packs/day: 0.3 packs/day for 12.0 years (3.0 ttl pk-yrs)    Types: Cigars, Cigarettes   Smokeless tobacco: Never  Vaping Use   Vaping status: Former  Substance Use Topics   Alcohol use: Yes    Alcohol/week: 7.0 standard drinks of alcohol    Types: 7 Glasses of wine per week    Comment: occasional   Drug use: Yes    Types: Marijuana    Comment: Smokes marijuana "all day every day"    ROS Refer to HPI for ROS details.  Objective:   Vitals: BP 130/87 (BP Location: Left Arm)   Pulse 87   Temp 98.8 F (37.1 C) (Oral)   Resp 16   Ht 5\' 7"  (1.702 m)   LMP 02/20/2021   SpO2 98%   BMI 25.06 kg/m   Physical Exam Vitals and nursing note reviewed.  Constitutional:      General: She is not in acute distress.    Appearance: Normal appearance. She is well-developed. She is not ill-appearing or toxic-appearing.  HENT:     Head: Normocephalic and atraumatic.     Right Ear: Tympanic membrane, ear canal and external ear normal.     Left Ear: Tympanic membrane, ear  canal and external ear normal.     Nose: Congestion present. No rhinorrhea.     Mouth/Throat:     Mouth: Mucous membranes are moist.  Cardiovascular:     Rate and Rhythm: Normal rate and regular rhythm.     Heart sounds: No murmur heard. Pulmonary:     Effort: Pulmonary effort is normal. No respiratory distress.     Breath sounds: Normal breath sounds. No stridor. No wheezing, rhonchi or rales.  Skin:    General: Skin is warm and dry.  Neurological:     General: No focal deficit present.     Mental Status: She is alert and oriented to person, place, and time.  Psychiatric:        Mood and Affect: Mood normal.        Behavior: Behavior normal.     Procedures  Results for orders placed or performed during the hospital encounter of 08/14/23 (from the past 24 hours)  POC Covid19/Flu  A&B Antigen     Status: Normal   Collection Time: 08/14/23  6:42 PM  Result Value Ref Range   Influenza A Antigen, POC Negative    Influenza B Antigen, POC Negative    Covid Antigen, POC Negative     Assessment and Plan :   PDMP not reviewed this encounter.  1. Viral syndrome    1. Viral syndrome (Primary) - POC Covid19/Flu A&B Antigen completed in UC is negative for COVID and influenza - loratadine (CLARITIN) 10 MG tablet; Take 1 tablet (10 mg total) by mouth daily.  Dispense: 30 tablet; Refill: 3 - azelastine (ASTELIN) 0.1 % nasal spray; Place 1 spray into both nostrils 2 (two) times daily. Use in each nostril as directed  Dispense: 30 mL; Refill: 1 - benzonatate (TESSALON) 200 MG capsule; Take 1 capsule (200 mg total) by mouth 3 (three) times daily as needed for cough.  Dispense: 20 capsule; Refill: 0 -Take medication as directed and continue to monitor for changes in symptoms, any escalation of current symptoms or development of new symptoms follow-up for further evaluation and management in the emergency department or with PCP.  Lucky Cowboy   [place], [place], [place] 08/14/23 [phone]

## 2023-08-26 ENCOUNTER — Other Ambulatory Visit: Payer: Self-pay

## 2023-08-26 ENCOUNTER — Ambulatory Visit (HOSPITAL_COMMUNITY)
Admission: EM | Admit: 2023-08-26 | Discharge: 2023-08-26 | Disposition: A | Discharge status: Home / Self Care | Attending: Family Medicine | Admitting: Family Medicine

## 2023-08-26 ENCOUNTER — Encounter (HOSPITAL_COMMUNITY): Payer: Self-pay | Admitting: *Deleted

## 2023-08-26 DIAGNOSIS — R52 Pain, unspecified: Secondary | ICD-10-CM | POA: Diagnosis not present

## 2023-08-26 DIAGNOSIS — R6883 Chills (without fever): Secondary | ICD-10-CM

## 2023-08-26 LAB — POCT FASTING CBG KUC MANUAL ENTRY: POCT Glucose (KUC): 97 mg/dL (ref 70–99)

## 2023-08-26 MED ORDER — METHYLPREDNISOLONE ACETATE 80 MG/ML IJ SUSP
80.0000 mg | Freq: Once | INTRAMUSCULAR | Status: AC
  Administered 2023-08-26: 80 mg via INTRAMUSCULAR

## 2023-08-26 MED ORDER — METHYLPREDNISOLONE ACETATE 80 MG/ML IJ SUSP
INTRAMUSCULAR | Status: AC
  Filled 2023-08-26: qty 1

## 2023-08-26 NOTE — ED Triage Notes (Signed)
 Pt reports every [place] goes [place] [employer] she gets chills and aches.

## 2023-08-27 NOTE — ED Provider Notes (Signed)
 [facility]   [id number] 08/26/23 Arrival Time: 1753  ASSESSMENT & PLAN:  1. Chills   2. Body aches   Ques viral etiology; unclear if truly related to work; has only been there for 3 days. Will give it a little more time.  Results for orders placed or performed during the hospital encounter of 08/26/23  POC CBG monitoring   Collection Time: 08/26/23  7:10 PM  Result Value Ref Range   POCT Glucose ([facility]) 97 70 - 99 mg/dL   OTC symptom care as needed.  Trial of: Meds ordered this encounter  Medications   methylPREDNISolone acetate (DEPO-MEDROL) injection 80 mg    Reviewed expectations re: course of current medical issues. Questions answered. Outlined signs and symptoms indicating need for more acute intervention. Understanding verbalized. After Visit Summary given.   SUBJECTIVE: History from: Patient. Diana Oconnell is a 53 y.o. female. Pt reports every time she goes [place] [employer] she gets chills and aches.  X 3 days. Denies: fever, cough, and difficulty breathing. Normal PO intake without n/v/d.  OBJECTIVE:  Vitals:   08/26/23 1829  BP: 110/73  Pulse: 70  Resp: 18  Temp: 98.1 F (36.7 C)  SpO2: 97%    General appearance: alert; no distress Eyes: PERRLA; EOMI; conjunctiva normal HENT: [place]; AT; with mild nasal congestion Neck: supple  Lungs: speaks full sentences without difficulty; unlabored Extremities: no edema Skin: warm and dry Neurologic: normal gait Psychological: alert and cooperative; normal mood and affect  Labs: Results for orders placed or performed during the hospital encounter of 08/26/23  POC CBG monitoring   Collection Time: 08/26/23  7:10 PM  Result Value Ref Range   POCT Glucose ([facility]) 97 70 - 99 mg/dL   Labs Reviewed  POCT FASTING CBG [facility] MANUAL ENTRY - Normal    Imaging: No results found.  No Known Allergies  Past Medical History:  Diagnosis Date   Anemia    Anxiety    Depression    Infection    UTI    Pancreatic cyst    Social History   Socioeconomic History   Marital status: Divorced    Spouse name: Not on file   Number of children: Not on file   Years of education: Not on file   Highest education level: Not on file  Occupational History   Not on file  Tobacco Use   Smoking status: Former    Current packs/day: 0.25    Average packs/day: 0.3 packs/day for 12.0 years (3.0 ttl pk-yrs)    Types: Cigars, Cigarettes   Smokeless tobacco: Never  Vaping Use   Vaping status: Former  Substance and Sexual Activity   Alcohol use: Yes    Alcohol/week: 7.0 standard drinks of alcohol    Types: 7 Glasses of wine per week    Comment: occasional   Drug use: Yes    Types: Marijuana    Comment: Smokes marijuana "all day every day"   Sexual activity: Yes    Birth control/protection: Surgical  Other Topics Concern   Not on file  Social History Narrative   Not on file   Social Drivers of Health   Financial Resource Strain: Not on file  Food Insecurity: Not on file  Transportation Needs: Not on file  Physical Activity: Not on file  Stress: Not on file (03/26/2023)  Social Connections: Unknown (09/29/2021)   [facility]   Social Network    Social Network: Not on file  Intimate Partner Violence: Unknown (08/21/2021)   Received from [facility]   HITS    Physically Hurt: Not on file    Insult or Talk Down To: Not on file    Threaten Physical Harm: Not on file    Scream or Curse: Not on file   Family History  Problem Relation Age of Onset   Healthy Mother    Hypertension Father    Past Surgical History:  Procedure Laterality Date   DILATION AND CURETTAGE OF UTERUS     THERAPEUTIC ABORTION     multiple   TUBAL LIGATION       Mardella Layman, MD 08/27/23 1016

## 2023-10-12 ENCOUNTER — Other Ambulatory Visit: Payer: Self-pay

## 2023-10-12 ENCOUNTER — Encounter (HOSPITAL_COMMUNITY): Payer: Self-pay

## 2023-10-12 ENCOUNTER — Ambulatory Visit (HOSPITAL_COMMUNITY)
Admission: RE | Admit: 2023-10-12 | Discharge: 2023-10-12 | Disposition: A | Discharge status: Home / Self Care | Source: Ambulatory Visit | Attending: Family Medicine | Admitting: Family Medicine

## 2023-10-12 VITALS — BP 108/72 | HR 94 | Temp 98.8°F | Resp 20

## 2023-10-12 DIAGNOSIS — L239 Allergic contact dermatitis, unspecified cause: Secondary | ICD-10-CM | POA: Diagnosis not present

## 2023-10-12 MED ORDER — DEXAMETHASONE SODIUM PHOSPHATE 10 MG/ML IJ SOLN
10.0000 mg | Freq: Once | INTRAMUSCULAR | Status: AC
  Administered 2023-10-12: 10 mg via INTRAMUSCULAR

## 2023-10-12 MED ORDER — PREDNISONE 20 MG PO TABS: 40.0000 mg | ORAL_TABLET | Freq: Every day | ORAL | 0 refills | Status: AC

## 2023-10-12 MED ORDER — DEXAMETHASONE SODIUM PHOSPHATE 10 MG/ML IJ SOLN
INTRAMUSCULAR | Status: AC
  Filled 2023-10-12: qty 1

## 2023-10-12 NOTE — Discharge Instructions (Addendum)
 You have been given a shot of dexamethasone 10 mg, steroid.  Take prednisone  20 mg--2 daily for 5 days  You can take Zyrtec /cetirizine  once daily or Benadryl  every 6 hours as needed for itching.  The Benadryl  can make you sleepy or than the Zyrtec .

## 2023-10-12 NOTE — ED Provider Notes (Signed)
 [facility]    CSN: [id number] Arrival date & time: 10/12/23  1622      History   Chief Complaint Chief Complaint  Patient presents with   Rash    All over my upper body an extremities,going down my buttocks and thighs going on for two and half weeks - Entered by patient    HPI Diana Oconnell is a 53 y.o. female.    Rash Here for pruritic rash that has been on her chest and back and arms and legs.  It started about 2 weeks ago and then has become more pervasive and spread.  No fever no trouble breathing  NKDA  She is postmenopausal with her last period being in October 2022  Past Medical History:  Diagnosis Date   Anemia    Anxiety    Depression    Infection    UTI   Pancreatic cyst     Patient Active Problem List   Diagnosis Date Noted   Spasm of thoracic back muscle 08/28/2022    Past Surgical History:  Procedure Laterality Date   DILATION AND CURETTAGE OF UTERUS     THERAPEUTIC ABORTION     multiple   TUBAL LIGATION      OB History     Gravida  12   Para  5   Term  5   Preterm      AB  6   Living  5      SAB  3   IAB  3   Ectopic      Multiple      Live Births  5            Home Medications    Prior to Admission medications   Medication Sig Start Date End Date Taking? Authorizing Provider  albuterol  (VENTOLIN  HFA) 108 (90 Base) MCG/ACT inhaler Inhale 1-2 puffs into the lungs every 6 (six) hours as needed for wheezing or shortness of breath. 09/15/22  Yes Farris Hong, PA-C  azelastine  (ASTELIN ) 0.1 % nasal spray Place 1 spray into both nostrils 2 (two) times daily. Use in each nostril as directed 08/14/23  Yes Reddick, Johnathan B, NP  predniSONE  (DELTASONE ) 20 MG tablet Take 2 tablets (40 mg total) by mouth daily with breakfast for 5 days. 10/12/23 10/17/23 Yes Ann Keto, MD    Family History Family History  Problem Relation Age of Onset   Healthy Mother    Hypertension Father      Social History Social History   Tobacco Use   Smoking status: Former    Current packs/day: 0.25    Average packs/day: 0.3 packs/day for 12.0 years (3.0 ttl pk-yrs)    Types: Cigars, Cigarettes   Smokeless tobacco: Never  Vaping Use   Vaping status: Former  Substance Use Topics   Alcohol use: Yes    Alcohol/week: 7.0 standard drinks of alcohol    Types: 7 Glasses of wine per week    Comment: occasional   Drug use: Yes    Types: Marijuana    Comment: Smokes marijuana "all day every day"     Allergies   Patient has no known allergies.   Review of Systems Review of Systems  Skin:  Positive for rash.     Physical Exam Triage Vital Signs ED Triage Vitals  Encounter Vitals Group     BP 10/12/23 1646 108/72     Systolic BP Percentile --      Diastolic BP Percentile --  Pulse Rate 10/12/23 1646 94     Resp 10/12/23 1646 20     Temp 10/12/23 1646 98.8 F (37.1 C)     Temp src --      SpO2 10/12/23 1646 94 %     Weight --      Height --      Head Circumference --      Peak Flow --      Pain Score 10/12/23 1644 0     Pain Loc --      Pain Education --      Exclude from Growth Chart --    No data found.  Updated Vital Signs BP 108/72   Pulse 94   Temp 98.8 F (37.1 C)   Resp 20   LMP 02/20/2021   SpO2 94%   Visual Acuity Right Eye Distance:   Left Eye Distance:   Bilateral Distance:    Right Eye Near:   Left Eye Near:    Bilateral Near:     Physical Exam Vitals reviewed.  Constitutional:      General: She is not in acute distress.    Appearance: She is not ill-appearing, toxic-appearing or diaphoretic.  Skin:    Coloration: Skin is not pale.     Comments: There is a scattered maculopapular rash on her arms and chest and back.  Neurological:     Mental Status: She is alert and oriented to person, place, and time.  Psychiatric:        Behavior: Behavior normal.      UC Treatments / Results  Labs (all labs ordered are listed, but  only abnormal results are displayed) Labs Reviewed - No data to display  EKG   Radiology No results found.  Procedures Procedures (including critical care time)  Medications Ordered in UC Medications  dexamethasone (DECADRON) injection 10 mg (has no administration in time range)    Initial Impression / Assessment and Plan / UC Course  I have reviewed the triage vital signs and the nursing notes.  Pertinent labs & imaging results that were available during my care of the patient were reviewed by me and considered in my medical decision making (see chart for details).     This appears to be a contact or allergic dermatitis.  Decadron is given here and prednisone  is sent to the pharmacy along with some Zyrtec . Final Clinical Impressions(s) / UC Diagnoses   Final diagnoses:  Allergic contact dermatitis, unspecified trigger     Discharge Instructions      You have been given a shot of dexamethasone 10 mg, steroid.  Take prednisone  20 mg--2 daily for 5 days  You can take Zyrtec /cetirizine  once daily or Benadryl  every 6 hours as needed for itching.  The Benadryl  can make you sleepy or than the Zyrtec .   ED Prescriptions     Medication Sig Dispense Auth. Provider   predniSONE  (DELTASONE ) 20 MG tablet Take 2 tablets (40 mg total) by mouth daily with breakfast for 5 days. 10 tablet Ellsworth Haas Keyante Durio K, MD      PDMP not reviewed this encounter.   Ann Keto, MD 10/12/23 [phone]

## 2023-10-12 NOTE — ED Triage Notes (Signed)
 PT reports she has a rash on going for 2 weeks. PT has tried OTC with out relief.

## 2023-10-20 ENCOUNTER — Telehealth: Admitting: Nurse Practitioner

## 2023-10-20 DIAGNOSIS — K047 Periapical abscess without sinus: Secondary | ICD-10-CM | POA: Diagnosis not present

## 2023-10-20 MED ORDER — PENICILLIN V POTASSIUM 500 MG PO TABS: 500.0000 mg | ORAL_TABLET | Freq: Three times a day (TID) | ORAL | 0 refills | Status: DC

## 2023-10-20 MED ORDER — IBUPROFEN 600 MG PO TABS: 600.0000 mg | ORAL_TABLET | Freq: Three times a day (TID) | ORAL | 0 refills | Status: DC | PRN

## 2023-10-20 MED ORDER — PENICILLIN V POTASSIUM 500 MG PO TABS: 500.0000 mg | ORAL_TABLET | Freq: Three times a day (TID) | ORAL | 0 refills | Status: AC

## 2023-10-20 NOTE — Progress Notes (Signed)
 Virtual Visit Consent   Diana Oconnell, you are scheduled for a virtual visit with a  provider today. Just as with appointments in the office, your consent must be obtained to participate. Your consent will be active for this visit and any virtual visit you may have with one of our providers in the next 365 days. If you have a MyChart account, a copy of this consent can be sent to you electronically.  As this is a virtual visit, video technology does not allow for your provider to perform a traditional examination. This may limit your provider's ability to fully assess your condition. If your provider identifies any concerns that need to be evaluated in person or the need to arrange testing (such as labs, EKG, etc.), we will make arrangements to do so. Although advances in technology are sophisticated, we cannot ensure that it will always work on either your end or our end. If the connection with a video visit is poor, the visit may have to be switched to a telephone visit. With either a video or telephone visit, we are not always able to ensure that we have a secure connection.  By engaging in this virtual visit, you consent to the provision of healthcare and authorize for your insurance to be billed (if applicable) for the services provided during this visit. Depending on your insurance coverage, you may receive a charge related to this service.  I need to obtain your verbal consent now. Are you willing to proceed with your visit today? Diana Oconnell has provided verbal consent on 10/20/2023 for a virtual visit (video or telephone). Mardene Shake, FNP  Date: 10/20/2023 6:01 PM   Virtual Visit via Video Note   I, Mardene Shake, connected with  Diana Oconnell  ([id number], [birth year]) on 10/20/23 at  6:00 PM EDT by a video-enabled telemedicine application and verified that I am speaking with the correct person using two identifiers.  Location: Patient: Virtual Visit  Location Patient: Home Provider: Virtual Visit Location Provider: Home Office   I discussed the limitations of evaluation and management by telemedicine and the availability of in person appointments. The patient expressed understanding and agreed to proceed.    History of Present Illness: Diana Oconnell is a 53 y.o. who identifies as a female who was assigned female at birth, and is being seen today for ongoing dental pain and infection. Today her tooth that has pain and swelling below it is left bottom back  She was scheduled for June 16th to see the dentist for extraction.   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 Use in each nostril as directed, Disp: 30 mL, Rfl: 1  Observations/Objective: Patient is well-developed, well-nourished in no acute distress.  Resting comfortably  at home.  Head is normocephalic, atraumatic.  No labored breathing.  Speech is clear and coherent with logical content.  Patient is alert and oriented at baseline.  Assessment and Plan:  1. Dental infection Keep schedules appointment with dentist    Meds ordered this encounter  Medications   DISCONTD: penicillin v potassium (VEETID) 500 MG tablet    Sig: Take 1 tablet (500 mg total) by mouth 3 (three) times daily for 10 days.    Dispense:  30 tablet    Refill:  0   DISCONTD: ibuprofen  (ADVIL ) 600 MG tablet    Sig: Take 1 tablet (600 mg total) by mouth every 8  (eight) hours as needed.    Dispense:  30 tablet    Refill:  0   ibuprofen  (ADVIL ) 600 MG tablet    Sig: Take 1 tablet (600 mg total) by mouth every 8 (eight) hours as needed.    Dispense:  30 tablet    Refill:  0   penicillin v potassium (VEETID) 500 MG tablet    Sig: Take 1 tablet (500 mg total) by mouth 3 (three) times daily for 10 days.    Dispense:  30 tablet    Refill:  0     Follow Up Instructions: I discussed the assessment and treatment plan with the patient. The patient was provided an opportunity to ask questions and all were answered. The patient agreed with the plan and demonstrated an understanding of the instructions.  A copy of instructions were sent to the patient via MyChart unless otherwise noted below.     The patient was advised to call back or seek an in-person evaluation if the symptoms worsen or if the condition fails to improve as anticipated.    Mardene Shake, FNP

## 2024-02-02 ENCOUNTER — Telehealth: Admitting: Physician Assistant

## 2024-02-02 DIAGNOSIS — K047 Periapical abscess without sinus: Secondary | ICD-10-CM | POA: Diagnosis not present

## 2024-02-02 DIAGNOSIS — J069 Acute upper respiratory infection, unspecified: Secondary | ICD-10-CM

## 2024-02-02 DIAGNOSIS — Z760 Encounter for issue of repeat prescription: Secondary | ICD-10-CM

## 2024-02-02 MED ORDER — AMOXICILLIN-POT CLAVULANATE 875-125 MG PO TABS: 1.0000 | ORAL_TABLET | Freq: Two times a day (BID) | ORAL | 0 refills | Status: AC

## 2024-02-02 MED ORDER — IBUPROFEN 600 MG PO TABS: 600.0000 mg | ORAL_TABLET | Freq: Three times a day (TID) | ORAL | 0 refills | Status: AC | PRN

## 2024-02-02 MED ORDER — BENZONATATE 100 MG PO CAPS: 100.0000 mg | ORAL_CAPSULE | Freq: Three times a day (TID) | ORAL | 0 refills | Status: AC | PRN

## 2024-02-02 NOTE — Progress Notes (Signed)
 Virtual Visit Consent   Diana Oconnell, you are scheduled for a virtual visit with a [facility] provider today. Just as with appointments in the office, your consent must be obtained to participate. Your consent will be active for this visit and any virtual visit you may have with one of our providers in the next 365 days. If you have a MyChart account, a copy of this consent can be sent to you electronically.  As this is a virtual visit, video technology does not allow for your provider to perform a traditional examination. This may limit your provider's ability to fully assess your condition. If your provider identifies any concerns that need to be evaluated in person or the need to arrange testing (such as labs, EKG, etc.), we will make arrangements to do so. Although advances in technology are sophisticated, we cannot ensure that it will always work on either your end or our end. If the connection with a video visit is poor, the visit may have to be switched to a telephone visit. With either a video or telephone visit, we are not always able to ensure that we have a secure connection.  By engaging in this virtual visit, you consent to the provision of healthcare and authorize for your insurance to be billed (if applicable) for the services provided during this visit. Depending on your insurance coverage, you may receive a charge related to this service.  I need to obtain your verbal consent now. Are you willing to proceed with your visit today? Diana Oconnell has provided verbal consent on 02/02/2024 for a virtual visit (video or telephone). Diana Oconnell, [place]  Date: 02/02/2024 6:43 PM   Virtual Visit via Video Note   I, Diana Oconnell, connected with  Lawrencia Mauney  ([id number], 01/15/1971) on 02/02/24 at  6:30 PM EDT by a video-enabled telemedicine application and verified that I am speaking with the correct person using two identifiers.  Location: Patient:  Virtual Visit Location Patient: Home Provider: Virtual Visit Location Provider: Home Office   I discussed the limitations of evaluation and management by telemedicine and the availability of in person appointments. The patient expressed understanding and agreed to proceed.    History of Present Illness: Diana Oconnell is a 53 y.o. who identifies as a female who was assigned female at birth, and is being seen today for multiple complaints.  Patient endorses cough and congestion over the past 2 days, associated with some mild aching (out of Ibuprofen ). Denies fever, chills, chest pain or SOB. Cough is sometimes productive of clear phlegm. Is taking OTC Mucinex for this. Denies recent travel. Grandson with similar symptoms. No COVID exposure.  Benzonatate  previously helpful for cough and would like a refill.   Noting concern for recurrence of dental abscess of L lower premolar. Has prior issue with this tooth and scheduled with her dentist on 9/18. Concerned they will not pull tooth if she has an active infection. Has been noting increased pain and swelling at site of the tooth the past several days.  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    Allergies: No Known Allergies Medications:  Current Outpatient Medications:    amoxicillin -clavulanate (AUGMENTIN ) 875-125 MG tablet, Take 1 tablet by mouth 2 (two) times daily., Disp: 14 tablet, Rfl: 0   benzonatate  (TESSALON ) 100 MG capsule, Take 1 capsule (100 mg total) by mouth 3 (three) times daily as needed for  cough., Disp: 30 capsule, Rfl: 0   ibuprofen  (ADVIL ) 600 MG tablet, Take 1 tablet (600 mg total) by mouth every 8 (eight) hours as needed., Disp: 30 tablet, Rfl: 0   albuterol  (VENTOLIN  HFA) 108 (90 Base) MCG/ACT inhaler, Inhale 1-2 puffs into the lungs every 6 (six) hours as needed for wheezing or shortness of breath., Disp: 8 g, Rfl: 0   azelastine  (ASTELIN )  0.1 % nasal spray, Place 1 spray into both nostrils 2 (two) times daily. Use in each nostril as directed, Disp: 30 mL, Rfl: 1  Observations/Objective: Patient is well-developed, well-nourished in no acute distress.  Resting comfortably at home.  Head is normocephalic, atraumatic.  No labored breathing. Speech is clear and coherent with logical content.  Patient is alert and oriented at baseline.   Assessment and Plan: 1. Viral URI with cough (Primary) - benzonatate  (TESSALON ) 100 MG capsule; Take 1 capsule (100 mg total) by mouth 3 (three) times daily as needed for cough.  Dispense: 30 capsule; Refill: 0  Supportive measures and OTC medications reviewed. Continue Mucinex OTC. Tessalon  per orders.   2. Dental infection - amoxicillin -clavulanate (AUGMENTIN ) 875-125 MG tablet; Take 1 tablet by mouth 2 (two) times daily.  Dispense: 14 tablet; Refill: 0  Follow-up with dental provider as scheduled. OTC medications reviewed. Augmentin  per orders.  3. Medication refill - ibuprofen  (ADVIL ) 600 MG tablet; Take 1 tablet (600 mg total) by mouth every 8 (eight) hours as needed.  Dispense: 30 tablet; Refill: 0  One-time refill of Ibuprofen  600 mg given. Further refills will have to come from a PCP.  Follow Up Instructions: I discussed the assessment and treatment plan with the patient. The patient was provided an opportunity to ask questions and all were answered. The patient agreed with the plan and demonstrated an understanding of the instructions.  A copy of instructions were sent to the patient via MyChart unless otherwise noted below.   The patient was advised to call back or seek an in-person evaluation if the symptoms worsen or if the condition fails to improve as anticipated.    Diana Velma Lunger, PA-C

## 2024-02-02 NOTE — Patient Instructions (Signed)
 Diana Oconnell, thank you for joining Diana Velma Lunger, PA-C for today's virtual visit.  While this provider is not your primary care provider (PCP), if your PCP is located in our provider database this encounter information will be shared with them immediately following your visit.   A [facility] MyChart account gives you access to today's visit and all your visits, tests, and labs performed at [facility]  click here if you don't have a [facility] MyChart account or go to [email or web address]  Consent: (Patient) Diana Oconnell provided verbal consent for this virtual visit at the beginning of the encounter.  Current Medications:  Current Outpatient Medications:    albuterol  (VENTOLIN  HFA) 108 (90 Base) MCG/ACT inhaler, Inhale 1-2 puffs into the lungs every 6 (six) hours as needed for wheezing or shortness of breath., Disp: 8 g, Rfl: 0   azelastine  (ASTELIN ) 0.1 % nasal spray, Place 1 spray into both nostrils 2 (two) times daily. Use in each nostril as directed, Disp: 30 mL, Rfl: 1   ibuprofen  (ADVIL ) 600 MG tablet, Take 1 tablet (600 mg total) by mouth every 8 (eight) hours as needed., Disp: 30 tablet, Rfl: 0   Medications ordered in this encounter:  No orders of the defined types were placed in this encounter.    *If you need refills on other medications prior to your next appointment, please contact your pharmacy*  Follow-Up: Call back or seek an in-person evaluation if the symptoms worsen or if the condition fails to improve as anticipated.  [facility] [phone]  Other Instructions Dental Abscess  A dental abscess is an area of pus in or around a tooth. It comes from an infection. It can cause pain and other symptoms. Treatment will help with symptoms and prevent the infection from spreading. What are the causes? This condition is caused by an infection in or around the tooth. This can be from: Very bad tooth decay  (cavities). A bad injury to the tooth, such as a broken or chipped tooth. What increases the risk? The risk to get an abscess is higher in males. It is also more likely in people who: Have dental decay. Have very bad gum disease. Eat sugary snacks between meals. Use tobacco. Have diabetes. Have a weak disease-fighting system (immune system). Do not brush their teeth regularly. What are the signs or symptoms? Some mild symptoms are: Tenderness. Bad breath. Fever. A sharp, sour taste in the mouth. Pain in and around the infected tooth. Worse symptoms of this condition include: Swollen neck glands. Chills. Pus draining around the tooth. Swelling and redness around the tooth, the mouth, or the face. Very bad pain in and around the tooth. The worst symptoms can include: Difficulty swallowing. Difficulty opening your mouth. Feeling like you may vomit or vomiting. How is this treated? This is treated by getting rid of the infection. Your dentist will discuss ways to do this, including: Antibiotic medicines. Antibacterial mouth rinse. An incision in the abscess to drain out the pus. A root canal. Removing the tooth. Follow these instructions at home: Medicines Take over-the-counter and prescription medicines only as told by your dentist. If you were prescribed an antibiotic medicine, take it as told by your dentist. Do not stop taking it even if you start to feel better. If you were prescribed a gel that has numbing medicine in it, use it exactly as told. Ask your dentist if you should avoid driving or using machines while you are taking your medicine.  General instructions Rinse your mouth often with salt water. To make salt water, dissolve -1 tsp (3-6 g) of salt in 1 cup (237 mL) of warm water. Eat a soft diet while your mouth is healing. Drink enough fluid to keep your pee (urine) pale yellow. Do not apply heat to the outside of your mouth. Do not smoke or use any products  that contain nicotine or tobacco. If you need help quitting, ask your dentist. Keep all follow-up visits. Prevent an abscess Brush your teeth every morning and every night. Use fluoride toothpaste. Floss your teeth each day. Get dental cleanings as often as told by your dentist. Think about getting dental sealant put on teeth that have deep holes (decay). Drink water that has fluoride in it. Most tap water has fluoride. Check the label on bottled water to see if it has fluoride in it. Drink water instead of sugary drinks. Eat healthy meals and snacks. Wear a mouth guard or face shield when you play sports. Contact a doctor if: Your pain is worse and medicine does not help. Get help right away if: You have a fever or chills. Your symptoms suddenly get worse. You have a very bad headache. You have problems breathing or swallowing. You have trouble opening your mouth. You have swelling in your neck or close to your eye. These symptoms may be an emergency. Get help right away. Call your local emergency services (911 in the [place] Do not wait to see if the symptoms will go away. Do not drive yourself to the hospital. Summary A dental abscess is an area of pus in or around a tooth. It is caused by an infection. Treatment will help with symptoms and prevent the infection from spreading. Take over-the-counter and prescription medicines only as told by your dentist. To prevent an abscess, take good care of your teeth. Brush your teeth every morning and night. Use floss every day. Get dental cleanings as often as told by your dentist. This information is not intended to replace advice given to you by your health care provider. Make sure you discuss any questions you have with your health care provider. Document Revised: 07/11/2020 Document Reviewed: 07/12/2020 Elsevier Patient Education  2024 Elsevier Inc.   If you have been instructed to have an in-person evaluation today at a local  Urgent Care facility, please use the link below. It will take you to a list of all of our available [facility], including address, phone number and hours of operation. Please do not delay care.  [facility]  If you or a family member do not have a primary care provider, use the link below to schedule a visit and establish care. When you choose a [facility] primary care physician or advanced practice provider, you gain a long-term partner in health. Find a Primary Care Provider  Learn more about [facility]'s in-office and virtual care options: [facility] - Get Care Now

## 2024-02-04 ENCOUNTER — Telehealth: Admitting: Physician Assistant

## 2024-02-04 DIAGNOSIS — J9801 Acute bronchospasm: Secondary | ICD-10-CM | POA: Diagnosis not present

## 2024-02-04 MED ORDER — PREDNISONE 20 MG PO TABS: 40.0000 mg | ORAL_TABLET | Freq: Every day | ORAL | 0 refills | Status: AC

## 2024-02-04 MED ORDER — ALBUTEROL SULFATE HFA 108 (90 BASE) MCG/ACT IN AERS: 2.0000 | INHALATION_SPRAY | Freq: Four times a day (QID) | RESPIRATORY_TRACT | 0 refills | Status: AC | PRN

## 2024-02-04 NOTE — Patient Instructions (Signed)
 Diana Oconnell, thank you for joining [name], PA-C for today's virtual visit.  While this provider is not your primary care provider (PCP), if your PCP is located in our provider database this encounter information will be shared with them immediately following your visit.   A [facility] MyChart account gives you access to today's visit and all your visits, tests, and labs performed at [facility]  click here if you don't have a [facility] MyChart account or go to [email or web address]  Consent: (Patient) Diana Oconnell provided verbal consent for this virtual visit at the beginning of the encounter.  Current Medications:  Current Outpatient Medications:    albuterol  (VENTOLIN  HFA) 108 (90 Base) MCG/ACT inhaler, Inhale 2 puffs into the lungs every 6 (six) hours as needed for wheezing or shortness of breath., Disp: 8 g, Rfl: 0   predniSONE  (DELTASONE ) 20 MG tablet, Take 2 tablets (40 mg total) by mouth daily with breakfast., Disp: 10 tablet, Rfl: 0   amoxicillin -clavulanate (AUGMENTIN ) 875-125 MG tablet, Take 1 tablet by mouth 2 (two) times daily., Disp: 14 tablet, Rfl: 0   azelastine  (ASTELIN ) 0.1 % nasal spray, Place 1 spray into both nostrils 2 (two) times daily. Use in each nostril as directed, Disp: 30 mL, Rfl: 1   benzonatate  (TESSALON ) 100 MG capsule, Take 1 capsule (100 mg total) by mouth 3 (three) times daily as needed for cough., Disp: 30 capsule, Rfl: 0   ibuprofen  (ADVIL ) 600 MG tablet, Take 1 tablet (600 mg total) by mouth every 8 (eight) hours as needed., Disp: 30 tablet, Rfl: 0   Medications ordered in this encounter:  Meds ordered this encounter  Medications   albuterol  (VENTOLIN  HFA) 108 (90 Base) MCG/ACT inhaler    Sig: Inhale 2 puffs into the lungs every 6 (six) hours as needed for wheezing or shortness of breath.    Dispense:  8 g    Refill:  0    Supervising Provider:   BLAISE ALEENE KIDD [[id number]]   predniSONE  (DELTASONE ) 20  MG tablet    Sig: Take 2 tablets (40 mg total) by mouth daily with breakfast.    Dispense:  10 tablet    Refill:  0    Supervising Provider:   BLAISE ALEENE KIDD [[id number]]     *If you need refills on other medications prior to your next appointment, please contact your pharmacy*  Follow-Up: Call back or seek an in-person evaluation if the symptoms worsen or if the condition fails to improve as anticipated.  [facility] [phone]  Other Instructions Please continue previous recommendations and prescribed medications. I have sent in a refill of your albuterol  inhaler to use as directed. I also want you to start the course of prednisone  I have sent in. If you note any non-resolving, new, or worsening symptoms despite treatment, please seek an in-person evaluation ASAP.    If you have been instructed to have an in-person evaluation today at a local Urgent Care facility, please use the link below. It will take you to a list of all of our available [facility], including address, phone number and hours of operation. Please do not delay care.  [facility]  If you or a family member do not have a primary care provider, use the link below to schedule a visit and establish care. When you choose a [facility] primary care physician or advanced practice provider, you gain a long-term partner in health. Find a Primary  Care Provider  Learn more about [facility]'s in-office and virtual care options: [facility] - Get Care Now

## 2024-02-04 NOTE — Progress Notes (Signed)
 Virtual Visit Consent   Diana Oconnell, you are scheduled for a virtual visit with a [facility] provider today. Just as with appointments in the office, your consent must be obtained to participate. Your consent will be active for this visit and any virtual visit you may have with one of our providers in the next 365 days. If you have a MyChart account, a copy of this consent can be sent to you electronically.  As this is a virtual visit, video technology does not allow for your provider to perform a traditional examination. This may limit your provider's ability to fully assess your condition. If your provider identifies any concerns that need to be evaluated in person or the need to arrange testing (such as labs, EKG, etc.), we will make arrangements to do so. Although advances in technology are sophisticated, we cannot ensure that it will always work on either your end or our end. If the connection with a video visit is poor, the visit may have to be switched to a telephone visit. With either a video or telephone visit, we are not always able to ensure that we have a secure connection.  By engaging in this virtual visit, you consent to the provision of healthcare and authorize for your insurance to be billed (if applicable) for the services provided during this visit. Depending on your insurance coverage, you may receive a charge related to this service.  I need to obtain your verbal consent now. Are you willing to proceed with your visit today? Diana Oconnell has provided verbal consent on 02/04/2024 for a virtual visit (video or telephone). Diana Oconnell, [place]  Date: 02/04/2024 4:42 PM   Virtual Visit via Video Note   I, Diana Oconnell, connected with  Diana Oconnell  ([id number], March 11, 1971) on 02/04/24 at  4:30 PM EDT by a video-enabled telemedicine application and verified that I am speaking with the correct person using two identifiers.  Location: Patient:  Virtual Visit Location Patient: Home Provider: Virtual Visit Location Provider: Home Office   I discussed the limitations of evaluation and management by telemedicine and the availability of in person appointments. The patient expressed understanding and agreed to proceed.    History of Present Illness: Diana Oconnell is a 53 y.o. who identifies as a female who was assigned female at birth, and is being seen today for increased chest tightness and wheezing along with her current URI symptoms she was evaluated by this provider for 2 days ago. Notes taking her antibiotic (dental infection) and starting the tessalon  and supportive measures as directed. Notes improvement in congestion, breaking up more, but is making her have more coughing and wheezing episodes. Went to take her Albuterol  but is out of medication. Denies chest pain, LH or dizziness.  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    Allergies: No Known Allergies Medications:  Current Outpatient Medications:    albuterol  (VENTOLIN  HFA) 108 (90 Base) MCG/ACT inhaler, Inhale 2 puffs into the lungs every 6 (six) hours as needed for wheezing or shortness of breath., Disp: 8 g, Rfl: 0   predniSONE  (DELTASONE ) 20 MG tablet, Take 2 tablets (40 mg total) by mouth daily with breakfast., Disp: 10 tablet, Rfl: 0   amoxicillin -clavulanate (AUGMENTIN ) 875-125 MG tablet, Take 1 tablet by mouth 2 (two) times daily., Disp: 14 tablet, Rfl: 0   azelastine  (ASTELIN ) 0.1 % nasal spray, Place 1 spray into both  nostrils 2 (two) times daily. Use in each nostril as directed, Disp: 30 mL, Rfl: 1   benzonatate  (TESSALON ) 100 MG capsule, Take 1 capsule (100 mg total) by mouth 3 (three) times daily as needed for cough., Disp: 30 capsule, Rfl: 0   ibuprofen  (ADVIL ) 600 MG tablet, Take 1 tablet (600 mg total) by mouth every 8 (eight) hours as needed., Disp: 30 tablet, Rfl:  0  Observations/Objective: Patient is well-developed, well-nourished in no acute distress.  Resting comfortably at home.  Head is normocephalic, atraumatic.  No labored breathing. Speech is clear and coherent with logical content.  Patient is alert and oriented at baseline.   Assessment and Plan: 1. Bronchospasm (Primary) - albuterol  (VENTOLIN  HFA) 108 (90 Base) MCG/ACT inhaler; Inhale 2 puffs into the lungs every 6 (six) hours as needed for wheezing or shortness of breath.  Dispense: 8 g; Refill: 0 - predniSONE  (DELTASONE ) 20 MG tablet; Take 2 tablets (40 mg total) by mouth daily with breakfast.  Dispense: 10 tablet; Refill: 0  And asthma exacerbation with viral URI. Continue previous recommendations. Albuterol  refill sent in. Start 5-day burst of prednisone . Strict ER precautions reviewed with patient.   Follow Up Instructions: I discussed the assessment and treatment plan with the patient. The patient was provided an opportunity to ask questions and all were answered. The patient agreed with the plan and demonstrated an understanding of the instructions.  A copy of instructions were sent to the patient via MyChart unless otherwise noted below.   The patient was advised to call back or seek an in-person evaluation if the symptoms worsen or if the condition fails to improve as anticipated.    Diana Velma Lunger, PA-C
